# Patient Record
Sex: Male | Born: 1960 | Race: White | Hispanic: No | Marital: Single | Smoking: Never smoker
Health system: Southern US, Community
[De-identification: ages and names within clinical notes are randomized; demographics above are authoritative.]

## PROBLEM LIST (undated history)

## (undated) DIAGNOSIS — E119 Type 2 diabetes mellitus without complications: Secondary | ICD-10-CM

## (undated) DIAGNOSIS — J189 Pneumonia, unspecified organism: Secondary | ICD-10-CM

## (undated) DIAGNOSIS — E669 Obesity, unspecified: Secondary | ICD-10-CM

## (undated) DIAGNOSIS — R0600 Dyspnea, unspecified: Secondary | ICD-10-CM

## (undated) DIAGNOSIS — I10 Essential (primary) hypertension: Secondary | ICD-10-CM

## (undated) HISTORY — PX: GASTRIC BYPASS: SHX52

## (undated) HISTORY — PX: WISDOM TOOTH EXTRACTION: SHX21

---

## 2019-07-02 ENCOUNTER — Inpatient Hospital Stay (HOSPITAL_COMMUNITY)
Admission: AD | Admit: 2019-07-02 | Discharge: 2019-07-07 | DRG: 287 | Disposition: A | Discharge status: Home / Self Care | Payer: Self-pay | Source: Other Acute Inpatient Hospital | Attending: Cardiology | Admitting: Cardiology

## 2019-07-02 ENCOUNTER — Inpatient Hospital Stay (HOSPITAL_COMMUNITY): Payer: Self-pay

## 2019-07-02 ENCOUNTER — Encounter (HOSPITAL_COMMUNITY): Payer: Self-pay | Admitting: Cardiology

## 2019-07-02 DIAGNOSIS — I483 Typical atrial flutter: Secondary | ICD-10-CM

## 2019-07-02 DIAGNOSIS — R0602 Shortness of breath: Secondary | ICD-10-CM | POA: Diagnosis present

## 2019-07-02 DIAGNOSIS — Z20822 Contact with and (suspected) exposure to covid-19: Secondary | ICD-10-CM | POA: Diagnosis present

## 2019-07-02 DIAGNOSIS — I472 Ventricular tachycardia, unspecified: Secondary | ICD-10-CM

## 2019-07-02 DIAGNOSIS — E1165 Type 2 diabetes mellitus with hyperglycemia: Secondary | ICD-10-CM | POA: Diagnosis present

## 2019-07-02 DIAGNOSIS — I5020 Unspecified systolic (congestive) heart failure: Secondary | ICD-10-CM

## 2019-07-02 DIAGNOSIS — Z7984 Long term (current) use of oral hypoglycemic drugs: Secondary | ICD-10-CM

## 2019-07-02 DIAGNOSIS — I4892 Unspecified atrial flutter: Secondary | ICD-10-CM | POA: Diagnosis present

## 2019-07-02 DIAGNOSIS — Z9884 Bariatric surgery status: Secondary | ICD-10-CM

## 2019-07-02 DIAGNOSIS — R451 Restlessness and agitation: Secondary | ICD-10-CM | POA: Diagnosis not present

## 2019-07-02 DIAGNOSIS — Z6841 Body Mass Index (BMI) 40.0 and over, adult: Secondary | ICD-10-CM

## 2019-07-02 DIAGNOSIS — I110 Hypertensive heart disease with heart failure: Secondary | ICD-10-CM | POA: Diagnosis present

## 2019-07-02 DIAGNOSIS — Z79899 Other long term (current) drug therapy: Secondary | ICD-10-CM

## 2019-07-02 DIAGNOSIS — Z23 Encounter for immunization: Secondary | ICD-10-CM

## 2019-07-02 HISTORY — DX: Essential (primary) hypertension: I10

## 2019-07-02 HISTORY — DX: Pneumonia, unspecified organism: J18.9

## 2019-07-02 HISTORY — DX: Dyspnea, unspecified: R06.00

## 2019-07-02 HISTORY — DX: Type 2 diabetes mellitus without complications: E11.9

## 2019-07-02 HISTORY — DX: Obesity, unspecified: E66.9

## 2019-07-02 LAB — CBC WITH DIFFERENTIAL/PLATELET
Abs Immature Granulocytes: 0.04 10*3/uL (ref 0.00–0.07)
Basophils Absolute: 0 10*3/uL (ref 0.0–0.1)
Basophils Relative: 0 %
Eosinophils Absolute: 0 10*3/uL (ref 0.0–0.5)
Eosinophils Relative: 0 %
HCT: 43.2 % (ref 39.0–52.0)
Hemoglobin: 13.8 g/dL (ref 13.0–17.0)
Immature Granulocytes: 1 %
Lymphocytes Relative: 18 %
Lymphs Abs: 1.5 10*3/uL (ref 0.7–4.0)
MCH: 29.9 pg (ref 26.0–34.0)
MCHC: 31.9 g/dL (ref 30.0–36.0)
MCV: 93.7 fL (ref 80.0–100.0)
Monocytes Absolute: 0.6 10*3/uL (ref 0.1–1.0)
Monocytes Relative: 7 %
Neutro Abs: 6.1 10*3/uL (ref 1.7–7.7)
Neutrophils Relative %: 74 %
Platelets: 283 10*3/uL (ref 150–400)
RBC: 4.61 MIL/uL (ref 4.22–5.81)
RDW: 13.5 % (ref 11.5–15.5)
WBC: 8.2 10*3/uL (ref 4.0–10.5)
nRBC: 0 % (ref 0.0–0.2)

## 2019-07-02 LAB — BASIC METABOLIC PANEL
Anion gap: 14 (ref 5–15)
BUN: 15 mg/dL (ref 6–20)
CO2: 23 mmol/L (ref 22–32)
Calcium: 8.5 mg/dL — ABNORMAL LOW (ref 8.9–10.3)
Chloride: 101 mmol/L (ref 98–111)
Creatinine, Ser: 1.27 mg/dL — ABNORMAL HIGH (ref 0.61–1.24)
GFR calc Af Amer: >60 mL/min (ref >60)
GFR calc non Af Amer: >60 mL/min (ref >60)
Glucose, Bld: 248 mg/dL — ABNORMAL HIGH (ref 70–99)
Potassium: 3.8 mmol/L (ref 3.5–5.1)
Sodium: 138 mmol/L (ref 135–145)

## 2019-07-02 LAB — HEMOGLOBIN A1C
Hgb A1c MFr Bld: 11.7 % — ABNORMAL HIGH (ref 4.8–5.6)
Mean Plasma Glucose: 289.09 mg/dL

## 2019-07-02 LAB — GLUCOSE, CAPILLARY
Glucose-Capillary: 229 mg/dL — ABNORMAL HIGH (ref 70–99)
Glucose-Capillary: 295 mg/dL — ABNORMAL HIGH (ref 70–99)

## 2019-07-02 LAB — HIV ANTIBODY (ROUTINE TESTING W REFLEX): HIV Screen 4th Generation wRfx: NONREACTIVE

## 2019-07-02 MED ORDER — INSULIN ASPART 100 UNIT/ML ~~LOC~~ SOLN
6.0000 [IU] | Freq: Three times a day (TID) | SUBCUTANEOUS | Status: DC
  Administered 2019-07-03 (×2): 6 [IU] via SUBCUTANEOUS

## 2019-07-02 MED ORDER — FUROSEMIDE 10 MG/ML IJ SOLN
40.0000 mg | Freq: Two times a day (BID) | INTRAMUSCULAR | Status: DC
  Administered 2019-07-02 – 2019-07-05 (×6): 40 mg via INTRAVENOUS
  Filled 2019-07-02 (×8): qty 4

## 2019-07-02 MED ORDER — ACETAMINOPHEN 325 MG PO TABS
650.0000 mg | ORAL_TABLET | ORAL | Status: DC | PRN
  Administered 2019-07-03: 650 mg via ORAL
  Filled 2019-07-02: qty 2

## 2019-07-02 MED ORDER — INSULIN ASPART 100 UNIT/ML ~~LOC~~ SOLN
0.0000 [IU] | Freq: Every day | SUBCUTANEOUS | Status: DC
  Administered 2019-07-02: 3 [IU] via SUBCUTANEOUS

## 2019-07-02 MED ORDER — ATORVASTATIN CALCIUM 40 MG PO TABS
40.0000 mg | ORAL_TABLET | Freq: Every day | ORAL | Status: DC
  Administered 2019-07-03 – 2019-07-07 (×5): 40 mg via ORAL
  Filled 2019-07-02 (×5): qty 1

## 2019-07-02 MED ORDER — PNEUMOCOCCAL VAC POLYVALENT 25 MCG/0.5ML IJ INJ
0.5000 mL | INJECTION | INTRAMUSCULAR | Status: AC
  Administered 2019-07-07: 0.5 mL via INTRAMUSCULAR
  Filled 2019-07-02: qty 0.5

## 2019-07-02 MED ORDER — ONDANSETRON HCL 4 MG/2ML IJ SOLN: 4.0000 mg | Freq: Four times a day (QID) | INTRAMUSCULAR | Status: DC | PRN

## 2019-07-02 MED ORDER — INSULIN ASPART 100 UNIT/ML ~~LOC~~ SOLN
0.0000 [IU] | Freq: Three times a day (TID) | SUBCUTANEOUS | Status: DC
  Administered 2019-07-03: 4 [IU] via SUBCUTANEOUS
  Administered 2019-07-03: 7 [IU] via SUBCUTANEOUS

## 2019-07-03 ENCOUNTER — Encounter (HOSPITAL_COMMUNITY): Payer: Self-pay | Admitting: Cardiology

## 2019-07-03 ENCOUNTER — Inpatient Hospital Stay (HOSPITAL_COMMUNITY): Payer: Self-pay

## 2019-07-03 LAB — GLUCOSE, CAPILLARY
Glucose-Capillary: 209 mg/dL — ABNORMAL HIGH (ref 70–99)
Glucose-Capillary: 223 mg/dL — ABNORMAL HIGH (ref 70–99)
Glucose-Capillary: 198 mg/dL — ABNORMAL HIGH (ref 70–99)
Glucose-Capillary: 233 mg/dL — ABNORMAL HIGH (ref 70–99)
Glucose-Capillary: 230 mg/dL — ABNORMAL HIGH (ref 70–99)
Glucose-Capillary: 202 mg/dL — ABNORMAL HIGH (ref 70–99)
Glucose-Capillary: 198 mg/dL — ABNORMAL HIGH (ref 70–99)

## 2019-07-03 LAB — MRSA PCR SCREENING: MRSA by PCR: POSITIVE — AB

## 2019-07-03 LAB — BASIC METABOLIC PANEL
Anion gap: 9 (ref 5–15)
BUN: 14 mg/dL (ref 6–20)
CO2: 26 mmol/L (ref 22–32)
Calcium: 8.5 mg/dL — ABNORMAL LOW (ref 8.9–10.3)
Chloride: 103 mmol/L (ref 98–111)
Creatinine, Ser: 1.21 mg/dL (ref 0.61–1.24)
GFR calc Af Amer: >60 mL/min (ref >60)
GFR calc non Af Amer: >60 mL/min (ref >60)
Glucose, Bld: 246 mg/dL — ABNORMAL HIGH (ref 70–99)
Potassium: 3.4 mmol/L — ABNORMAL LOW (ref 3.5–5.1)
Sodium: 138 mmol/L (ref 135–145)

## 2019-07-03 LAB — LIPID PANEL
Cholesterol: 177 mg/dL (ref 0–200)
HDL: 30 mg/dL — ABNORMAL LOW (ref >40)
LDL Cholesterol: 128 mg/dL — ABNORMAL HIGH (ref 0–99)
Total CHOL/HDL Ratio: 5.9 RATIO
Triglycerides: 96 mg/dL (ref <150)
VLDL: 19 mg/dL (ref 0–40)

## 2019-07-03 LAB — CBC
HCT: 41.7 % (ref 39.0–52.0)
Hemoglobin: 13.2 g/dL (ref 13.0–17.0)
MCH: 29.8 pg (ref 26.0–34.0)
MCHC: 31.7 g/dL (ref 30.0–36.0)
MCV: 94.1 fL (ref 80.0–100.0)
Platelets: 267 10*3/uL (ref 150–400)
RBC: 4.43 MIL/uL (ref 4.22–5.81)
RDW: 13.3 % (ref 11.5–15.5)
WBC: 8.3 10*3/uL (ref 4.0–10.5)
nRBC: 0 % (ref 0.0–0.2)

## 2019-07-03 LAB — HEPARIN LEVEL (UNFRACTIONATED)
Heparin Unfractionated: 0.17 IU/mL — ABNORMAL LOW (ref 0.30–0.70)
Heparin Unfractionated: 0.28 IU/mL — ABNORMAL LOW (ref 0.30–0.70)

## 2019-07-03 MED ORDER — INSULIN ASPART 100 UNIT/ML ~~LOC~~ SOLN
0.0000 [IU] | Freq: Three times a day (TID) | SUBCUTANEOUS | Status: DC
  Administered 2019-07-03 – 2019-07-04 (×2): 5 [IU] via SUBCUTANEOUS
  Administered 2019-07-04: 2 [IU] via SUBCUTANEOUS
  Administered 2019-07-04: 8 [IU] via SUBCUTANEOUS
  Administered 2019-07-05: 5 [IU] via SUBCUTANEOUS
  Administered 2019-07-05: 3 [IU] via SUBCUTANEOUS
  Administered 2019-07-06: 5 [IU] via SUBCUTANEOUS
  Administered 2019-07-06 (×2): 2 [IU] via SUBCUTANEOUS
  Administered 2019-07-07: 8 [IU] via SUBCUTANEOUS
  Administered 2019-07-07: 2 [IU] via SUBCUTANEOUS

## 2019-07-03 MED ORDER — CHLORHEXIDINE GLUCONATE CLOTH 2 % EX PADS
6.0000 | MEDICATED_PAD | Freq: Every day | CUTANEOUS | Status: DC
  Administered 2019-07-05 – 2019-07-06 (×2): 6 via TOPICAL

## 2019-07-03 MED ORDER — INSULIN STARTER KIT- PEN NEEDLES (ENGLISH)
1.0000 | Freq: Once | Status: DC
  Filled 2019-07-03: qty 1

## 2019-07-03 MED ORDER — HEPARIN BOLUS VIA INFUSION
5000.0000 [IU] | Freq: Once | INTRAVENOUS | Status: AC
  Administered 2019-07-03: 5000 [IU] via INTRAVENOUS
  Filled 2019-07-03: qty 5000

## 2019-07-03 MED ORDER — INSULIN DETEMIR 100 UNIT/ML ~~LOC~~ SOLN
18.0000 [IU] | Freq: Two times a day (BID) | SUBCUTANEOUS | Status: DC
  Filled 2019-07-03: qty 0.18

## 2019-07-03 MED ORDER — AMIODARONE HCL IN DEXTROSE 360-4.14 MG/200ML-% IV SOLN
30.0000 mg/h | INTRAVENOUS | Status: DC
  Administered 2019-07-03 – 2019-07-04 (×3): 30 mg/h via INTRAVENOUS
  Filled 2019-07-03 (×3): qty 200

## 2019-07-03 MED ORDER — INSULIN ASPART 100 UNIT/ML ~~LOC~~ SOLN
0.0000 [IU] | Freq: Every day | SUBCUTANEOUS | Status: DC
  Administered 2019-07-05: 2 [IU] via SUBCUTANEOUS

## 2019-07-03 MED ORDER — POTASSIUM CHLORIDE CRYS ER 20 MEQ PO TBCR
40.0000 meq | EXTENDED_RELEASE_TABLET | Freq: Once | ORAL | Status: AC
  Administered 2019-07-03: 40 meq via ORAL
  Filled 2019-07-03: qty 2

## 2019-07-03 MED ORDER — METOPROLOL SUCCINATE ER 50 MG PO TB24
50.0000 mg | ORAL_TABLET | Freq: Every day | ORAL | Status: DC
  Administered 2019-07-03 – 2019-07-07 (×5): 50 mg via ORAL
  Filled 2019-07-03 (×5): qty 1

## 2019-07-03 MED ORDER — LIVING WELL WITH DIABETES BOOK
Freq: Once | Status: AC
  Filled 2019-07-03: qty 1

## 2019-07-03 MED ORDER — CHLORHEXIDINE GLUCONATE CLOTH 2 % EX PADS: 6.0000 | MEDICATED_PAD | Freq: Every day | CUTANEOUS | Status: DC

## 2019-07-03 MED ORDER — MUPIROCIN 2 % EX OINT
1.0000 "application " | TOPICAL_OINTMENT | Freq: Two times a day (BID) | CUTANEOUS | Status: DC
  Administered 2019-07-03 – 2019-07-07 (×9): 1 via NASAL
  Filled 2019-07-03: qty 22

## 2019-07-03 MED ORDER — HEPARIN (PORCINE) 25000 UT/250ML-% IV SOLN
2200.0000 [IU]/h | INTRAVENOUS | Status: DC
  Administered 2019-07-03: 1650 [IU]/h via INTRAVENOUS
  Administered 2019-07-03: 1950 [IU]/h via INTRAVENOUS
  Administered 2019-07-04 – 2019-07-06 (×5): 2200 [IU]/h via INTRAVENOUS
  Filled 2019-07-03 (×7): qty 250

## 2019-07-03 MED ORDER — INSULIN DETEMIR 100 UNIT/ML ~~LOC~~ SOLN
18.0000 [IU] | Freq: Two times a day (BID) | SUBCUTANEOUS | Status: DC
  Administered 2019-07-03 – 2019-07-07 (×9): 18 [IU] via SUBCUTANEOUS
  Filled 2019-07-03 (×9): qty 0.18

## 2019-07-03 NOTE — Consult Note (Deleted)
CARDIOLOGY CONSULT NOTE  Patient ID: Ethan Hamilton MRN: [id number] DOB/AGE: 1960-07-14 59 y.o.  Admit date: 07/02/2019 Referring Physician: [facility] Primary Physician: [facility] Reason for Consultation:  VT  HPI:   59 y.o. [place] male  with hypertension, type 2 DM, morbid obesity, admitted with shortness of breath, atrial flutter, VT.  Patient is a retired Administrator, is uninsured, lives alone and does not have any family nearby. He has not seen a physician in a long time. Patient has had worsening shortness of breath and leg edema in 2-3 weeks. He denies chest pain. He presented to [facility] ED today, where he was found to be in atrial flutter. Subsequently, his shortness of breath worsened. He subsequently had VT, and had to be cardioverted. Given VT, I was consulted at the request of general cardiology on call Dr. Terrence Dupont.   On arrival to [facility], patient denies any chest pain. Shortness of breath is improved.   Past Medical History:  Diagnosis Date  . Diabetes mellitus without complication ([facility])   . Dyspnea   . Hypertension   . Pneumonia      Past Surgical History:  Procedure Laterality Date  . GASTRIC BYPASS    . WISDOM TOOTH EXTRACTION       History reviewed. No pertinent family history.   Social History: Social History   Socioeconomic History  . Marital status: Single    Spouse name: Not on file  . Number of children: Not on file  . Years of education: Not on file  . Highest education level: Not on file  Occupational History  . Not on file  Tobacco Use  . Smoking status: Not on file  Substance and Sexual Activity  . Alcohol use: Not Currently  . Drug use: Not Currently  . Sexual activity: Not Currently  Other Topics Concern  . Not on file  Social History Narrative  . Not on file   Social Determinants of Health   Financial Resource Strain:   . Difficulty of Paying Living Expenses:   Food Insecurity:   . Worried About Charity fundraiser in the  Last Year:   . Arboriculturist in the Last Year:   Transportation Needs:   . Film/video editor (Medical):   Marland Kitchen Lack of Transportation (Non-Medical):   Physical Activity:   . Days of Exercise per Week:   . Minutes of Exercise per Session:   Stress:   . Feeling of Stress :   Social Connections:   . Frequency of Communication with Friends and Family:   . Frequency of Social Gatherings with Friends and Family:   . Attends Religious Services:   . Active Member of Clubs or Organizations:   . Attends Archivist Meetings:   Marland Kitchen Marital Status:   Intimate Partner Violence:   . Fear of Current or Ex-Partner:   . Emotionally Abused:   Marland Kitchen Physically Abused:   . Sexually Abused:      Medications Prior to Admission  Medication Sig Dispense Refill Last Dose  . amLODipine (NORVASC) 5 MG tablet Take 5 mg by mouth 2 (two) times daily.   August 2020  . atorvastatin (LIPITOR) 40 MG tablet Take 40 mg by mouth daily.   August 2020  . glimepiride (AMARYL) 4 MG tablet Take 4 mg by mouth 2 (two) times daily. Breakfast and supper   August 2020  . lisinopril-hydrochlorothiazide (ZESTORETIC) 20-12.5 MG tablet Take 1 tablet by mouth 2 (two) times daily.  August 2020  . metFORMIN (GLUCOPHAGE-XR) 500 MG 24 hr tablet Take 1,000 mg by mouth 2 (two) times daily.   August 2020  . metoprolol tartrate (LOPRESSOR) 50 MG tablet Take 50 mg by mouth 2 (two) times daily.   August 2020  . Semaglutide, 1 MG/DOSE, 2 MG/1.5ML SOPN Inject 0.75 mLs into the skin once a week.   August 2020    Review of Systems  Constitution: Negative for decreased appetite, malaise/fatigue, weight gain and weight loss.  HENT: Negative for congestion.   Eyes: Negative for visual disturbance.  Cardiovascular: Positive for dyspnea on exertion and leg swelling. Negative for chest pain, palpitations and syncope.  Respiratory: Negative for cough.   Endocrine: Negative for cold intolerance.  Hematologic/Lymphatic: Does not  bruise/bleed easily.  Skin: Negative for itching and rash.  Musculoskeletal: Negative for myalgias.  Gastrointestinal: Negative for abdominal pain, nausea and vomiting.  Genitourinary: Negative for dysuria.  Neurological: Negative for dizziness and weakness.  Psychiatric/Behavioral: The patient is not nervous/anxious.   All other systems reviewed and are negative.     Physical Exam: Physical Exam  Constitutional: He is oriented to person, place, and time. He appears well-developed and well-nourished. No distress.  Morbid obesity  HENT:  Head: Normocephalic and atraumatic.  Eyes: Pupils are equal, round, and reactive to light. Conjunctivae are normal.  Neck: No JVD present.  Cardiovascular: Normal rate, regular rhythm and intact distal pulses.  No murmur heard. Pulmonary/Chest: Effort normal. He has no wheezes. He has rales.  Abdominal: Soft. Bowel sounds are normal. There is no rebound.  Musculoskeletal:        General: Edema (2+ b/l) present.  Lymphadenopathy:    He has no cervical adenopathy.  Neurological: He is alert and oriented to person, place, and time. No cranial nerve deficit.  Skin: Skin is warm and dry.  Psychiatric: He has a normal mood and affect.  Nursing note and vitals reviewed.    Labs:   Lab Results  Component Value Date   WBC 8.2 07/02/2019   HGB 13.8 07/02/2019   HCT 43.2 07/02/2019   MCV 93.7 07/02/2019   PLT 283 07/02/2019    Recent Labs  Lab 07/02/19 2138  NA 138  K 3.8  CL 101  CO2 23  BUN 15  CREATININE 1.27*  CALCIUM 8.5*  GLUCOSE 248*    Lipid Panel  No results found for: CHOL, TRIG, HDL, CHOLHDL, VLDL, LDLCALC  BNP (last 3 results) No results for input(s): BNP in the last 8760 hours.  HEMOGLOBIN A1C Lab Results  Component Value Date   HGBA1C 11.7 (H) 07/02/2019   MPG 289.09 07/02/2019    Cardiac Panel (last 3 results) No results for input(s): CKTOTAL, CKMB, RELINDX in the last 8760 hours.  Invalid input(s):  TROPONINHS  No results found for: CKTOTAL, CKMB, CKMBINDEX   TSH No results for input(s): TSH in the last 8760 hours.    Radiology: DG Chest 1 View  Result Date: 07/02/2019 CLINICAL DATA:  Shortness of breath EXAM: CHEST  1 VIEW COMPARISON:  07/02/2019 FINDINGS: Cardiomegaly with vascular congestion and hazy perihilar interstitial opacity. Possible small right effusion. No pneumothorax. IMPRESSION: Cardiomegaly with vascular congestion and bilateral perihilar hazy opacity, possible mild edema. Probable small right effusion. Electronically Signed   By: Jasmine Pang M.D.   On: 07/02/2019 22:11    Scheduled Meds: . atorvastatin  40 mg Oral Daily  . furosemide  40 mg Intravenous Q12H  . insulin aspart  0-20 Units Subcutaneous TID  WC  . insulin aspart  0-5 Units Subcutaneous QHS  . insulin aspart  6 Units Subcutaneous TID WC  . pneumococcal 23 valent vaccine  0.5 mL Intramuscular Tomorrow-1000   Continuous Infusions: PRN Meds:.acetaminophen, ondansetron (ZOFRAN) IV  CARDIAC STUDIES:  EKG 07/02/2019: Sinus rhythm, left axis deviation, occasional PAC  Echocardiogram pending:  Assessment & Recommendations:  59 y.o. [place] male  with hypertension, type 2 DM, morbid obesity, admitted with shortness of breath, atrial flutter, VT.  Paroxysmal atrial flutter: Currently in sinus rhythm. CHA2DS2CVAsc score at least 2, likely higher-as suspecting heart failure Continue heparin for now with possibility of requiring cardiac catheterization at some point.   VT: Monomorphic VT. Currently resolved. Obtain echocardiogram.  DM: SSI, and meal coverage Consult diabetes care coordinator  Hypertension: Uncontrolled. Will likely add [facility] tomorrow  Elder Negus, MD 07/03/2019, 1:00 AM [facility] Pager: [phone] Office: [phone] If no answer Cell [phone]

## 2019-07-03 NOTE — Progress Notes (Signed)
Echo pending Rate and BP controlled Needs to follow strict I/O monitoring Transfer to progressive care Will wean off amiodarone tomorrow Potentially cath next week Needs social work input. Uninsured patient  Elder Negus, MD [facility] Pager: [phone] Office: [phone]

## 2019-07-03 NOTE — Consult Note (Signed)
WOC Nurse Consult Note: Patient receiving care in [facility] 2H20.  Patient sitting in chair eating breakfast, watching TV at the time of my visit. Reason for Consult: "undiagnosed wound, right lower leg" Wound type: No open wounds present to RLE, only dried, scabbed, scattered areas along the pretibial area. Pressure Injury POA: Yes/No/NA Measurement: Wound bed: Hemosiderin staining present to gaiter area of BLE, RLE staining > than LLE. Drainage (amount, consistency, odor) none Periwound: intact Dressing procedure/placement/frequency: No dressing or topical treatment needed at this time.  The patient does need properly sized compression hose with appropriate pressure capability to prevent further deterioration in the condition of his legs, all related to venous insufficiency.  The patient is willing to try compression hose.  For now he is willing to keep his legs elevated as much as possible. Monitor the wound area(s) for worsening of condition such as: Signs/symptoms of infection,  Increase in size,  Development of or worsening of odor, Development of pain, or increased pain at the affected locations.  Notify the medical team if any of these develop.  Thank you for the consult.  Discussed plan of care with the patient and bedside nurse.  WOC nurse will not follow at this time.  Please re-consult the WOC team if needed.  Helmut Muster, RN, MSN, CWOCN, CNS-BC, pager [phone]

## 2019-07-03 NOTE — Progress Notes (Signed)
Patient arrives to [facility] and walks directly into the bathroom.  Unable to measure urine output. Monitors applied and vital signs obtained.  Levimir due at 1800 given in left arm.  Report to night shift

## 2019-07-03 NOTE — Progress Notes (Addendum)
Inpatient Diabetes Program Recommendations  AACE/ADA: New Consensus Statement on Inpatient Glycemic Control (2015)  Target Ranges:  Prepandial:   less than 140 mg/dL      Peak postprandial:   less than 180 mg/dL (1-2 hours)      Critically ill patients:  140 - 180 mg/dL   Lab Results  Component Value Date   GLUCAP 198 (H) 07/03/2019   HGBA1C 11.7 (H) 07/02/2019    Review of Glycemic Control Results for Ethan Hamilton, Ethan Hamilton (MRN [id number]) as of 07/03/2019 10:59  Ref. Range 07/02/2019 21:48 07/02/2019 23:52 07/03/2019 04:00 07/03/2019 06:32 07/03/2019 06:55  Glucose-Capillary Latest Ref Range: 70 - 99 mg/dL 229 (H) 295 (H) 223 (H) 233 (H) 198 (H)   Diabetes history: DM2 Outpatient Diabetes medications: Amaryl 4 mg bid + Metformin 1 gm bid + Semaglutide 0.75 q week Current orders for Inpatient glycemic control: Novolog 6 units tid meal coverage + Novolog resistant tid + hs 0-5 units  Inpatient Diabetes Program Recommendations:   -Add Levemir 18 units bid (0.2 units/kg x 178 kg) -Decrease Novolog correction to moderate tid + hs 0-5 units  Consults to transition of care, Living Well With Diabetes book, starter kit for insulin teaching if patient goes home on insulin. Noted patient does not have insurance, so may utilize buying insulin 70/30 Novolin insulin from [facility]. Will speak with patient to determine total patient needs. 1:10 pm Met with patient @ bedside. 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 Another job opportunity fell through and now does not have funds to purchase his medications and behind in rent and living expenses. States his A1c was between 6-7 when he could afford to take his medications.  Thank you, Nani Gasser. Rylin Saez, RN, MSN, CDE  Diabetes Coordinator Inpatient Glycemic Control Team Team Pager [phone] (8am-5pm) 07/03/2019 11:05 AM

## 2019-07-03 NOTE — Progress Notes (Addendum)
ANTICOAGULATION CONSULT NOTE   Pharmacy Consult for heparin Indication: atrial fibrillation  No Known Allergies  Patient Measurements: Height: 5' 10"  (177.8 cm) Weight: (!) 178 kg (392 lb 6.7 oz) IBW/kg (Calculated) : 73 Heparin Dosing Weight: 117 kg  Vital Signs: Temp: 97.4 F (36.3 C) (05/21 2002) Temp Source: Oral (05/21 2002) BP: 145/106 (05/21 2002) Pulse Rate: 83 (05/21 2002)  Labs: Recent Labs    07/02/19 2138 07/03/19 0351 07/03/19 0942 07/03/19 1937  HGB 13.8 13.2  --   --   HCT 43.2 41.7  --   --   PLT 283 267  --   --   HEPARINUNFRC  --   --  0.17* 0.28*  CREATININE 1.27* 1.21  --   --     Estimated Creatinine Clearance: 106.9 mL/min (by C-G formula based on SCr of 1.21 mg/dL).   Medical History: Past Medical History:  Diagnosis Date  . Diabetes mellitus without complication ([place])   . Dyspnea   . Hypertension   . Obesity   . Pneumonia     Medications:  Scheduled:  . atorvastatin  40 mg Oral Daily  . [START ON 07/04/2019] Chlorhexidine Gluconate Cloth  6 each Topical Q0600  . furosemide  40 mg Intravenous Q12H  . insulin aspart  0-15 Units Subcutaneous TID WC  . insulin aspart  0-5 Units Subcutaneous QHS  . insulin detemir  18 Units Subcutaneous BID  . insulin starter kit- pen needles  1 kit Other Once  . metoprolol succinate  50 mg Oral Daily  . mupirocin ointment  1 application Nasal BID  . pneumococcal 23 valent vaccine  0.5 mL Intramuscular Tomorrow-1000    Assessment: 21 yom admitted with SOB and atrial fibrillation - not on AC PTA.   Hgb 13.2, plt 267 - stable. No s/sx of bleeding. Will continue heparin for now and follow up for additional procedures.   Heparin level improved but remains slightly below goal (0.28). No bleeding or issues with infusion per discussion with RN.  Goal of Therapy:  Heparin level 0.3-0.7 units/ml Monitor platelets by anticoagulation protocol: Yes   Plan:  Increase heparin infusion to 2200 units/hr 6hr  heparin level Monitor daily heparin level and CBC, s/sx bleeding   Arturo Morton, PharmD, BCPS Please check AMION for all [facility] contact numbers Clinical Pharmacist 07/03/2019 8:08 PM

## 2019-07-03 NOTE — H&P (Signed)
Ethan Hamilton is an 59 y.o. male.   Chief Complaint: Shortness of breath HPI:   59 y.o. [place] male  with hypertension, type 2 DM, morbid obesity, admitted with shortness of breath, atrial flutter, VT.  Patient is a retired Naval architect, is uninsured, lives alone and does not have any family nearby. He has not seen a physician in a long time. Patient has had worsening shortness of breath and leg edema in 2-3 weeks. He denies chest pain. He presented to [facility] ED today, where he was found to be in atrial flutter. Subsequently, his shortness of breath worsened. He subsequently had VT, and had to be cardioverted. Given VT, I was consulted at the request of general cardiology on call Dr. Sharyn Lull.   On arrival to [facility], patient denies any chest pain. Shortness of breath is improved.    Past Medical History:  Diagnosis Date  . Diabetes mellitus without complication ([facility])   . Dyspnea   . Hypertension   . Obesity   . Pneumonia     Past Surgical History:  Procedure Laterality Date  . GASTRIC BYPASS    . WISDOM TOOTH EXTRACTION      History reviewed. No pertinent family history. Social History:  reports previous alcohol use. He reports previous drug use. No history on file for tobacco.  Allergies: No Known Allergies  Review of Systems  Constitution: Negative for decreased appetite, malaise/fatigue, weight gain and weight loss.  HENT: Negative for congestion.   Eyes: Negative for visual disturbance.  Cardiovascular: Positive for dyspnea on exertion and leg swelling. Negative for chest pain, palpitations and syncope.  Respiratory: Negative for cough.   Endocrine: Negative for cold intolerance.  Hematologic/Lymphatic: Does not bruise/bleed easily.  Skin: Negative for itching and rash.  Musculoskeletal: Negative for myalgias.  Gastrointestinal: Negative for abdominal pain, nausea and vomiting.  Genitourinary: Negative for dysuria.  Neurological: Negative for dizziness and weakness.   Psychiatric/Behavioral: The patient is not nervous/anxious.   All other systems reviewed and are negative.    Blood pressure (!) 139/113, pulse 74, temperature 98.3 F (36.8 C), temperature source Oral, resp. rate 19, height 5\' 10"  (1.778 m), weight (!) 178 kg, SpO2 93 %. Body mass index is 56.31 kg/m.  Physical Exam  Constitutional: He is oriented to person, place, and time. He appears well-developed and well-nourished. No distress.  Morbid obesity  HENT:  Head: Normocephalic and atraumatic.  Eyes: Pupils are equal, round, and reactive to light. Conjunctivae are normal.  Neck: No JVD present.  Cardiovascular: Normal rate, regular rhythm and intact distal pulses.  Pulmonary/Chest: Effort normal and breath sounds normal. He has no wheezes. He has no rales.  Abdominal: Soft. Bowel sounds are normal. There is no rebound.  Musculoskeletal:        General: Edema (2+ b/l) present.  Lymphadenopathy:    He has no cervical adenopathy.  Neurological: He is alert and oriented to person, place, and time. No cranial nerve deficit.  Skin: Skin is warm and dry.  Psychiatric: He has a normal mood and affect.  Nursing note and vitals reviewed.   Results for orders placed or performed during the hospital encounter of 07/02/19 (from the past 48 hour(s))  HIV Antibody (routine testing w rflx)     Status: None   Collection Time: 07/02/19  9:38 PM  Result Value Ref Range   HIV Screen 4th Generation wRfx Non Reactive Non Reactive    Comment: Performed at [facility], [address],  [place], [place] [address]  Basic metabolic panel     Status: Abnormal   Collection Time: 07/02/19  9:38 PM  Result Value Ref Range   Sodium 138 135 - 145 mmol/L   Potassium 3.8 3.5 - 5.1 mmol/L   Chloride 101 98 - 111 mmol/L   CO2 23 22 - 32 mmol/L   Glucose, Bld 248 (H) 70 - 99 mg/dL    Comment: Glucose reference range applies only to samples taken after fasting for at least 8 hours.   BUN 15 6 - 20 mg/dL    Creatinine, Ser 1.27 (H) 0.61 - 1.24 mg/dL   Calcium 8.5 (L) 8.9 - 10.3 mg/dL   GFR calc non Af Amer >60 >60 mL/min   GFR calc Af Amer >60 >60 mL/min   Anion gap 14 5 - 15    Comment: Performed at [facility] [address], [place], [place] [address]  Hemoglobin A1c     Status: Abnormal   Collection Time: 07/02/19  9:38 PM  Result Value Ref Range   Hgb A1c MFr Bld 11.7 (H) 4.8 - 5.6 %    Comment: (NOTE) Pre diabetes:          5.7%-6.4% Diabetes:              >6.4% Glycemic control for   <7.0% adults with diabetes    Mean Plasma Glucose 289.09 mg/dL    Comment: Performed at [facility] [address] [place],  [address]  CBC WITH DIFFERENTIAL     Status: None   Collection Time: 07/02/19  9:38 PM  Result Value Ref Range   WBC 8.2 4.0 - 10.5 K/uL   RBC 4.61 4.22 - 5.81 MIL/uL   Hemoglobin 13.8 13.0 - 17.0 g/dL   HCT 43.2 39.0 - 52.0 %   MCV 93.7 80.0 - 100.0 fL   MCH 29.9 26.0 - 34.0 pg   MCHC 31.9 30.0 - 36.0 g/dL   RDW 13.5 11.5 - 15.5 %   Platelets 283 150 - 400 K/uL   nRBC 0.0 0.0 - 0.2 %   Neutrophils Relative % 74 %   Neutro Abs 6.1 1.7 - 7.7 K/uL   Lymphocytes Relative 18 %   Lymphs Abs 1.5 0.7 - 4.0 K/uL   Monocytes Relative 7 %   Monocytes Absolute 0.6 0.1 - 1.0 K/uL   Eosinophils Relative 0 %   Eosinophils Absolute 0.0 0.0 - 0.5 K/uL   Basophils Relative 0 %   Basophils Absolute 0.0 0.0 - 0.1 K/uL   Immature Granulocytes 1 %   Abs Immature Granulocytes 0.04 0.00 - 0.07 K/uL    Comment: Performed at [facility], [address] [place], [place] [address]  Glucose, capillary     Status: Abnormal   Collection Time: 07/02/19  9:48 PM  Result Value Ref Range   Glucose-Capillary 229 (H) 70 - 99 mg/dL    Comment: Glucose reference range applies only to samples taken after fasting for at least 8 hours.  MRSA PCR Screening     Status: Abnormal   Collection Time: 07/02/19 11:20 PM   Specimen: Nasal Mucosa; Nasopharyngeal  Result  Value Ref Range   MRSA by PCR POSITIVE (A) NEGATIVE    Comment: CRITICAL RESULT CALLED TO, READ BACK BY AND VERIFIED WITH: RN, Jaci Lazier [phone] @0124  THANEY Performed at [facility] [address], [place], [place] [address]   Glucose, capillary     Status:  Abnormal   Collection Time: 07/02/19 11:52 PM  Result Value Ref Range   Glucose-Capillary 295 (H) 70 - 99 mg/dL    Comment: Glucose reference range applies only to samples taken after fasting for at least 8 hours.    Labs:   Lab Results  Component Value Date   WBC 8.2 07/02/2019   HGB 13.8 07/02/2019   HCT 43.2 07/02/2019   MCV 93.7 07/02/2019   PLT 283 07/02/2019    Recent Labs  Lab 07/02/19 2138  NA 138  K 3.8  CL 101  CO2 23  BUN 15  CREATININE 1.27*  CALCIUM 8.5*  GLUCOSE 248*    Lipid Panel  No results found for: CHOL, TRIG, HDL, CHOLHDL, VLDL, LDLCALC  BNP (last 3 results) No results for input(s): BNP in the last 8760 hours.  HEMOGLOBIN A1C Lab Results  Component Value Date   HGBA1C 11.7 (H) 07/02/2019   MPG 289.09 07/02/2019     Medications Prior to Admission  Medication Sig Dispense Refill  . amLODipine (NORVASC) 5 MG tablet Take 5 mg by mouth 2 (two) times daily.    Marland Kitchen atorvastatin (LIPITOR) 40 MG tablet Take 40 mg by mouth daily.    Marland Kitchen glimepiride (AMARYL) 4 MG tablet Take 4 mg by mouth 2 (two) times daily. Breakfast and supper    . lisinopril-hydrochlorothiazide (ZESTORETIC) 20-12.5 MG tablet Take 1 tablet by mouth 2 (two) times daily.    . metFORMIN (GLUCOPHAGE-XR) 500 MG 24 hr tablet Take 1,000 mg by mouth 2 (two) times daily.    . metoprolol tartrate (LOPRESSOR) 50 MG tablet Take 50 mg by mouth 2 (two) times daily.    . Semaglutide, 1 MG/DOSE, 2 MG/1.5ML SOPN Inject 0.75 mLs into the skin once a week.        Current Facility-Administered Medications:  .  acetaminophen (TYLENOL) tablet 650 mg, 650 mg, Oral, Q4H PRN, Patwardhan, Manish J, MD .  atorvastatin (LIPITOR) tablet 40 mg,  40 mg, Oral, Daily, Patwardhan, Manish J, MD .  furosemide (LASIX) injection 40 mg, 40 mg, Intravenous, Q12H, Patwardhan, Manish J, MD, 40 mg at 07/02/19 2208 .  insulin aspart (novoLOG) injection 0-20 Units, 0-20 Units, Subcutaneous, TID WC, Patwardhan, Manish J, MD .  insulin aspart (novoLOG) injection 0-5 Units, 0-5 Units, Subcutaneous, QHS, Patwardhan, Manish J, MD, 3 Units at 07/02/19 2356 .  insulin aspart (novoLOG) injection 6 Units, 6 Units, Subcutaneous, TID WC, Patwardhan, Manish J, MD .  ondansetron (ZOFRAN) injection 4 mg, 4 mg, Intravenous, Q6H PRN, Patwardhan, Manish J, MD .  pneumococcal 23 valent vaccine (PNEUMOVAX-23) injection 0.5 mL, 0.5 mL, Intramuscular, Tomorrow-1000, Patwardhan, Manish J, MD   Today's Vitals   07/02/19 2300 07/03/19 0000 07/03/19 0100 07/03/19 0129  BP: (!) 164/95 (!) 142/94 (!) 139/113   Pulse: 89 89 74   Resp: (!) 27 (!) 28 19   Temp:      TempSrc:      SpO2: 94% 96% 93%   Weight:    (!) 178 kg  Height:    5\' 10"  (1.778 m)  PainSc:  0-No pain     Body mass index is 56.31 kg/m.  CARDIAC STUDIES:  EKG 07/02/2019: Sinus rhythm, left axis deviation, occasional PAC  Echocardiogram pending:  Assessment & Recommendations:  59 y.o. 46 male  with hypertension, type 2 DM, morbid obesity, admitted with shortness of breath, atrial flutter, VT.  Paroxysmal atrial flutter: Currently in sinus rhythm. CHA2DS2CVAsc score at least 2, likely higher-as suspecting heart  failure Continue heparin for now with possibility of requiring cardiac catheterization at some point.   VT: Monomorphic VT. Currently resolved. Obtain echocardiogram.  DM: SSI, and meal coverage Consult diabetes care coordinator  Hypertension: Uncontrolled. Will likely add [facility] tomorrow Elder Negus, MD 07/03/2019, 1:37 AM [facility] Pager: [phone] Office: [phone] If no answer: [phone]

## 2019-07-03 NOTE — Progress Notes (Signed)
  Echocardiogram 2D Echocardiogram has been performed.  Delcie Roch 07/03/2019, 4:45 PM

## 2019-07-03 NOTE — Progress Notes (Signed)
ANTICOAGULATION CONSULT NOTE - Initial Consult  Pharmacy Consult for heparin Indication: atrial fibrillation  No Known Allergies  Patient Measurements: Height: 5\' 10"  (177.8 cm) Weight: (!) 178 kg (392 lb 6.7 oz) IBW/kg (Calculated) : 73 Heparin Dosing Weight: 117 kg  Vital Signs: Temp: 98.3 F (36.8 C) (05/20 2100) Temp Source: Oral (05/20 2100) BP: 139/113 (05/21 0100) Pulse Rate: 74 (05/21 0100)  Labs: Recent Labs    07/02/19 2138  HGB 13.8  HCT 43.2  PLT 283  CREATININE 1.27*    Estimated Creatinine Clearance: 101.9 mL/min (A) (by C-G formula based on SCr of 1.27 mg/dL (H)).   Medical History: Past Medical History:  Diagnosis Date  . Diabetes mellitus without complication (HCC)   . Dyspnea   . Hypertension   . Obesity   . Pneumonia     Medications:  Scheduled:  . atorvastatin  40 mg Oral Daily  . furosemide  40 mg Intravenous Q12H  . insulin aspart  0-20 Units Subcutaneous TID WC  . insulin aspart  0-5 Units Subcutaneous QHS  . insulin aspart  6 Units Subcutaneous TID WC  . pneumococcal 23 valent vaccine  0.5 mL Intramuscular Tomorrow-1000    Assessment: 58 yom admitted with SOB and atrial fibrillation - not on AC PTA.   Hgb 13.8, plt 283. No s/sx of bleeding. Plan for heparin in case of need for future procedures.   Goal of Therapy:  Heparin level 0.3-0.7 units/ml Monitor platelets by anticoagulation protocol: Yes   Plan:  Give 5000 units bolus x 1 Start heparin infusion at 1650 units/hr Check anti-Xa level in 6 hours and daily while on heparin Continue to monitor H&H and platelets  2139, PharmD, BCCCP Clinical Pharmacist  07/03/2019 1:49 AM  Please check AMION for all [facility] Pharmacy phone numbers After 10:00 PM, call Main Pharmacy [phone]

## 2019-07-03 NOTE — Progress Notes (Signed)
ANTICOAGULATION CONSULT NOTE   Pharmacy Consult for heparin Indication: atrial fibrillation  No Known Allergies  Patient Measurements: Height: 5\' 10"  (177.8 cm) Weight: (!) 178 kg (392 lb 6.7 oz) IBW/kg (Calculated) : 73 Heparin Dosing Weight: 117 kg  Vital Signs: Temp: 97.8 F (36.6 C) (05/21 0630) Temp Source: Oral (05/21 0630) BP: 153/95 (05/21 0900) Pulse Rate: 99 (05/21 0900)  Labs: Recent Labs    07/02/19 2138 07/03/19 0351 07/03/19 0942  HGB 13.8 13.2  --   HCT 43.2 41.7  --   PLT 283 267  --   HEPARINUNFRC  --   --  0.17*  CREATININE 1.27* 1.21  --     Estimated Creatinine Clearance: 106.9 mL/min (by C-G formula based on SCr of 1.21 mg/dL).   Medical History: Past Medical History:  Diagnosis Date  . Diabetes mellitus without complication (HCC)   . Dyspnea   . Hypertension   . Obesity   . Pneumonia     Medications:  Scheduled:  . atorvastatin  40 mg Oral Daily  . Chlorhexidine Gluconate Cloth  6 each Topical Daily  . furosemide  40 mg Intravenous Q12H  . insulin aspart  0-20 Units Subcutaneous TID WC  . insulin aspart  0-5 Units Subcutaneous QHS  . insulin aspart  6 Units Subcutaneous TID WC  . metoprolol succinate  50 mg Oral Daily  . pneumococcal 23 valent vaccine  0.5 mL Intramuscular Tomorrow-1000    Assessment: 58 yom admitted with SOB and atrial fibrillation - not on AC PTA.   Hgb 13.2, plt 267. No s/sx of bleeding. Will continue heparin for now and follow up for additional procedures.   Initial heparin level below goal (0.17). Will adjust rate.   Goal of Therapy:  Heparin level 0.3-0.7 units/ml Monitor platelets by anticoagulation protocol: Yes   Plan:  Increase heparin infusion to 1950 units/hr Check anti-Xa level in 6 hours and daily while on heparin Continue to monitor H&H and platelets  07/05/19 PharmD., BCPS Clinical Pharmacist 07/03/2019 10:56 AM

## 2019-07-03 NOTE — Progress Notes (Signed)
  Amiodarone Drug - Drug Interaction Consult Note  Recommendations: On atorvastatin and heparin currently - no interactions at this time. Monitor vital signs and for future interactions.   Amiodarone is metabolized by the cytochrome P450 system and therefore has the potential to cause many drug interactions. Amiodarone has an average plasma half-life of 50 days (range 20 to 100 days).   There is potential for drug interactions to occur several weeks or months after stopping treatment and the onset of drug interactions may be slow after initiating amiodarone.   [x]  Statins: Increased risk of myopathy. Simvastatin- restrict dose to 20mg  daily. Other statins: counsel patients to report any muscle pain or weakness immediately.  [x]  Anticoagulants: Amiodarone can increase anticoagulant effect. Consider warfarin dose reduction. Patients should be monitored closely and the dose of anticoagulant altered accordingly, remembering that amiodarone levels take several weeks to stabilize.  []  Antiepileptics: Amiodarone can increase plasma concentration of phenytoin, the dose should be reduced. Note that small changes in phenytoin dose can result in large changes in levels. Monitor patient and counsel on signs of toxicity.  []  Beta blockers: increased risk of bradycardia, AV block and myocardial depression. Sotalol - avoid concomitant use.  []   Calcium channel blockers (diltiazem and verapamil): increased risk of bradycardia, AV block and myocardial depression.  []   Cyclosporine: Amiodarone increases levels of cyclosporine. Reduced dose of cyclosporine is recommended.  []  Digoxin dose should be halved when amiodarone is started.  []  Diuretics: increased risk of cardiotoxicity if hypokalemia occurs.  []  Oral hypoglycemic agents (glyburide, glipizide, glimepiride): increased risk of hypoglycemia. Patient's glucose levels should be monitored closely when initiating amiodarone therapy.   []  Drugs that  prolong the QT interval:  Torsades de pointes risk may be increased with concurrent use - avoid if possible.  Monitor QTc, also keep magnesium/potassium WNL if concurrent therapy can't be avoided. Antibiotics: e.g. fluoroquinolones, erythromycin. . Antiarrhythmics: e.g. quinidine, procainamide, disopyramide, sotalol. . Antipsychotics: e.g. phenothiazines, haloperidol.  . Lithium, tricyclic antidepressants, and methadone.  Thank You,   , PharmD, BCCCP Clinical Pharmacist  07/03/2019 2:07 AM  Please check AMION for all [facility] Pharmacy phone numbers After 10:00 PM, call Main Pharmacy [phone]

## 2019-07-04 ENCOUNTER — Encounter (HOSPITAL_COMMUNITY): Payer: Self-pay | Admitting: Cardiology

## 2019-07-04 DIAGNOSIS — I5020 Unspecified systolic (congestive) heart failure: Secondary | ICD-10-CM

## 2019-07-04 LAB — BRAIN NATRIURETIC PEPTIDE: B Natriuretic Peptide: 134.2 pg/mL — ABNORMAL HIGH (ref 0.0–100.0)

## 2019-07-04 LAB — ECHOCARDIOGRAM COMPLETE
Height: 70 in
Weight: 6278.7 oz

## 2019-07-04 LAB — BASIC METABOLIC PANEL
Anion gap: 15 (ref 5–15)
BUN: 14 mg/dL (ref 6–20)
CO2: 22 mmol/L (ref 22–32)
Calcium: 8.5 mg/dL — ABNORMAL LOW (ref 8.9–10.3)
Chloride: 99 mmol/L (ref 98–111)
Creatinine, Ser: 1.09 mg/dL (ref 0.61–1.24)
GFR calc Af Amer: >60 mL/min (ref >60)
GFR calc non Af Amer: >60 mL/min (ref >60)
Glucose, Bld: 227 mg/dL — ABNORMAL HIGH (ref 70–99)
Potassium: 3.5 mmol/L (ref 3.5–5.1)
Sodium: 136 mmol/L (ref 135–145)

## 2019-07-04 LAB — GLUCOSE, CAPILLARY
Glucose-Capillary: 135 mg/dL — ABNORMAL HIGH (ref 70–99)
Glucose-Capillary: 218 mg/dL — ABNORMAL HIGH (ref 70–99)
Glucose-Capillary: 253 mg/dL — ABNORMAL HIGH (ref 70–99)
Glucose-Capillary: 115 mg/dL — ABNORMAL HIGH (ref 70–99)

## 2019-07-04 LAB — CBC
HCT: 43.5 % (ref 39.0–52.0)
Hemoglobin: 14 g/dL (ref 13.0–17.0)
MCH: 30 pg (ref 26.0–34.0)
MCHC: 32.2 g/dL (ref 30.0–36.0)
MCV: 93.3 fL (ref 80.0–100.0)
Platelets: 269 10*3/uL (ref 150–400)
RBC: 4.66 MIL/uL (ref 4.22–5.81)
RDW: 13.2 % (ref 11.5–15.5)
WBC: 7.2 10*3/uL (ref 4.0–10.5)
nRBC: 0 % (ref 0.0–0.2)

## 2019-07-04 LAB — HEPARIN LEVEL (UNFRACTIONATED): Heparin Unfractionated: 0.45 IU/mL (ref 0.30–0.70)

## 2019-07-04 MED ORDER — POTASSIUM CHLORIDE CRYS ER 20 MEQ PO TBCR
40.0000 meq | EXTENDED_RELEASE_TABLET | Freq: Once | ORAL | Status: AC
  Administered 2019-07-04: 40 meq via ORAL
  Filled 2019-07-04: qty 2

## 2019-07-04 MED ORDER — SPIRONOLACTONE 25 MG PO TABS
50.0000 mg | ORAL_TABLET | Freq: Every day | ORAL | Status: DC
  Administered 2019-07-04 – 2019-07-07 (×4): 50 mg via ORAL
  Filled 2019-07-04 (×4): qty 2

## 2019-07-04 MED ORDER — ASPIRIN 81 MG PO CHEW
81.0000 mg | CHEWABLE_TABLET | Freq: Every day | ORAL | Status: DC
  Administered 2019-07-04 – 2019-07-07 (×3): 81 mg via ORAL
  Filled 2019-07-04 (×4): qty 1

## 2019-07-04 NOTE — Plan of Care (Signed)

## 2019-07-04 NOTE — Progress Notes (Signed)
ANTICOAGULATION CONSULT NOTE   Pharmacy Consult for heparin Indication: atrial fibrillation  No Known Allergies  Patient Measurements: Height: 5' 10"  (177.8 cm) Weight: (!) 177.2 kg (390 lb 10.5 oz) IBW/kg (Calculated) : 73 Heparin Dosing Weight: 117 kg  Vital Signs: Temp: 98.3 F (36.8 C) (05/22 0739) Temp Source: Oral (05/22 0739) BP: 158/105 (05/22 0739) Pulse Rate: 90 (05/22 0739)  Labs: Recent Labs    07/02/19 2138 07/02/19 2138 07/03/19 0351 07/03/19 0942 07/03/19 1937 07/04/19 0806  HGB 13.8   < > 13.2  --   --  14.0  HCT 43.2  --  41.7  --   --  43.5  PLT 283  --  267  --   --  269  HEPARINUNFRC  --   --   --  0.17* 0.28* 0.45  CREATININE 1.27*  --  1.21  --   --  1.09   < > = values in this interval not displayed.    Estimated Creatinine Clearance: 118.4 mL/min (by C-G formula based on SCr of 1.09 mg/dL).   Medical History: Past Medical History:  Diagnosis Date  . Diabetes mellitus without complication ([place])   . Dyspnea   . Hypertension   . Obesity   . Pneumonia     Medications:  Scheduled:  . aspirin  81 mg Oral Daily  . atorvastatin  40 mg Oral Daily  . Chlorhexidine Gluconate Cloth  6 each Topical Q0600  . furosemide  40 mg Intravenous Q12H  . insulin aspart  0-15 Units Subcutaneous TID WC  . insulin aspart  0-5 Units Subcutaneous QHS  . insulin detemir  18 Units Subcutaneous BID  . insulin starter kit- pen needles  1 kit Other Once  . metoprolol succinate  50 mg Oral Daily  . mupirocin ointment  1 application Nasal BID  . pneumococcal 23 valent vaccine  0.5 mL Intramuscular Tomorrow-1000  . spironolactone  50 mg Oral Daily    Assessment: 35 yom admitted with SOB and atrial fibrillation - not on AC PTA.   Heparin level is now within goal 0.45. No bleeding or issues with infusion per discussion with RN.  Hgb 14, plt 269 - stable.  Goal of Therapy:  Heparin level 0.3-0.7 units/ml Monitor platelets by anticoagulation protocol: Yes    Plan:  Continue heparin infusion at 2200 units/hr Monitor daily heparin level and CBC, s/sx bleeding  Vertis Kelch, PharmD, [facility] PGY2 Cardiology Pharmacy Resident Phone [phone] 07/04/2019       10:21 AM  Please check [email or web address] for unit-specific pharmacist phone numbers

## 2019-07-04 NOTE — Plan of Care (Signed)

## 2019-07-04 NOTE — Care Management (Signed)
Consult for uninsured patient with potential insulin needs.   Prices are approximate:  Reli On [employer] Brand Meter $16.00 100 Test Strips $18.00 200 Lancets ~$3.00  [facility] OP Pharmacies with MATCH Letter Levemir Insulin Flex Pen Novolog Insulin Flex Pen Novolog Mix Insulin Flex Pen Pen Needles for the above as well Lantus pen IS NOT available with MATCH, but the VIALS are.  Pens can be filled at [facility] OP pharmacies ONLY! They are open M-F. [facility] Pharmacy is open with limited hours on Saturday   Vials of insulin can be filled with MATCH letter at any [facility] pharmacy

## 2019-07-04 NOTE — Progress Notes (Signed)
Pt with increased agitation related to monitor alarms keeping him from sleeping. Pt is cursing at nursing staff and refusing lab draws. Will continue to monitor.

## 2019-07-04 NOTE — Progress Notes (Signed)
Subjective:  Breathing improved.  Patient was very agitated with frequent monitor alarms and interruptions at night.   Objective:  Vital Signs in the last 24 hours: Temp:  [97.1 F (36.2 C)-98.4 F (36.9 C)] 98.3 F (36.8 C) (05/22 0739) Pulse Rate:  [81-90] 90 (05/22 0739) Resp:  [18-30] 25 (05/22 0739) BP: (116-173)/(83-159) 158/105 (05/22 0739) SpO2:  [96 %-99 %] 96 % (05/22 0739) Weight:  [177.2 kg] 177.2 kg (05/22 0339)  Intake/Output from previous day: 05/21 0701 - 05/22 0700 In: 2414.1 [P.O.:1440; I.V.:974.1] Out: 3050 [Urine:3050]  Physical Exam  Constitutional: He is oriented to person, place, and time. He appears well-developed and well-nourished. No distress.  Morbidly obese  HENT:  Head: Normocephalic and atraumatic.  Eyes: Pupils are equal, round, and reactive to light. Conjunctivae are normal.  Neck: No JVD present.  Cardiovascular: Normal rate, regular rhythm and intact distal pulses.  Murmur heard. High-pitched blowing holosystolic murmur is present with a grade of 3/6 at the apex. Pulmonary/Chest: Effort normal and breath sounds normal. He has no wheezes. He has no rales.  Abdominal: Soft. Bowel sounds are normal. There is no rebound.  Musculoskeletal:        General: Edema (1-2+ b/l Improving) present.  Lymphadenopathy:    He has no cervical adenopathy.  Neurological: He is alert and oriented to person, place, and time. No cranial nerve deficit.  Skin: Skin is warm and dry.  Area of erythema on right shin with healed eschar on top. No open wound  Psychiatric: He has a normal mood and affect.  Nursing note and vitals reviewed.    Lab Results: BMP Recent Labs    07/02/19 2138 07/03/19 0351 07/04/19 0806  NA 138 138 136  K 3.8 3.4* 3.5  CL 101 103 99  CO2 23 26 22   GLUCOSE 248* 246* 227*  BUN 15 14 14   CREATININE 1.27* 1.21 1.09  CALCIUM 8.5* 8.5* 8.5*  GFRNONAA >60 >60 >60  GFRAA >60 >60 >60    CBC Recent Labs  Lab 07/02/19 2138  07/03/19 0351 07/04/19 0806  WBC 8.2   < > 7.2  RBC 4.61   < > 4.66  HGB 13.8   < > 14.0  HCT 43.2   < > 43.5  PLT 283   < > 269  MCV 93.7   < > 93.3  MCH 29.9   < > 30.0  MCHC 31.9   < > 32.2  RDW 13.5   < > 13.2  LYMPHSABS 1.5  --   --   MONOABS 0.6  --   --   EOSABS 0.0  --   --   BASOSABS 0.0  --   --    < > = values in this interval not displayed.    HEMOGLOBIN A1C Lab Results  Component Value Date   HGBA1C 11.7 (H) 07/02/2019   MPG 289.09 07/02/2019     Lipid Panel     Component Value Date/Time   CHOL 177 07/03/2019 0351   TRIG 96 07/03/2019 0351   HDL 30 (L) 07/03/2019 0351   CHOLHDL 5.9 07/03/2019 0351   VLDL 19 07/03/2019 0351   LDLCALC 128 (H) 07/03/2019 0351    Trop (Not HS) 0.13-->0.17 at [facility]  Cardiac Studies:  EKG 07/02/2019: Sinus rhythm with arrhythmia. LAFB.  Echocardiogram 07/03/2019: 1. Left ventricular ejection fraction, by estimation, is 20 to 25%. The  left ventricle has severely decreased function. The left ventricle  demonstrates global hypokinesis. The left  ventricular internal cavity size  was mildly dilated. Left ventricular  diastolic parameters are consistent with Grade III diastolic dysfunction  (restrictive).  2. Right ventricular systolic function is low normal. The right  ventricular size is normal.  3. Left atrial size was moderately dilated.  4. Moderate mitral regurgitaion. Moderate tricuspid regurgitation.  5. Moderate pulmonary hypertension. Estimated RVSP 47 mmHg.   Assessment & Recommendations:  59 y.o.Cacuasianmalewith hypertension, type 2 DM, morbid obesity, paroxysmal atrial flutter, sustained VT, new diagnosis HFrEF  HFrEF: New diagnosis. LVEF 20-25%. In acute decompensation.  He has diuresed well with IV lasix. Refusing IV lasix today.  On Metoprolol succinate 50 mg daily. Added spironolactone 50 mg daily. Will add Entresto 49-51 mg bid tomorrow.  Strict I/O, daily weights.  Etiology includes  ischemic or nonischemic cardiomyopathy. Right and left heart cath tentatively on Tuesday 5/25.  Sustained VT: One episode ay [facility] ED requiring cardioversion. Now resolved.  Suspect related to decompensated heart failure. No recurrence.  Stop IV amiodarone. Continue heart failure management.  Paroxysmal atrial flutter: Currently in sinus rhythm. CHA2DS2CVAsc score at least 3, annual stroke risk 3.6% Continue heparin for now with possibility of requiring cardiac catheterization this hospitalization.  DM: Novolog 18 U bid, meal coverage  Hypertension: Uncontrolled. Management as above  Uninsured patient with medication needs. Needs social work consult.  Anticipate discharge 5/26  Ethan Hamilton, M.D. [facility] [place] Pager: [phone] Office: [phone]

## 2019-07-05 LAB — BASIC METABOLIC PANEL
Anion gap: 9 (ref 5–15)
BUN: 13 mg/dL (ref 6–20)
CO2: 28 mmol/L (ref 22–32)
Calcium: 8.4 mg/dL — ABNORMAL LOW (ref 8.9–10.3)
Chloride: 101 mmol/L (ref 98–111)
Creatinine, Ser: 1.13 mg/dL (ref 0.61–1.24)
GFR calc Af Amer: >60 mL/min (ref >60)
GFR calc non Af Amer: >60 mL/min (ref >60)
Glucose, Bld: 91 mg/dL (ref 70–99)
Potassium: 3.5 mmol/L (ref 3.5–5.1)
Sodium: 138 mmol/L (ref 135–145)

## 2019-07-05 LAB — CBC
HCT: 43.6 % (ref 39.0–52.0)
Hemoglobin: 14 g/dL (ref 13.0–17.0)
MCH: 29.9 pg (ref 26.0–34.0)
MCHC: 32.1 g/dL (ref 30.0–36.0)
MCV: 93.2 fL (ref 80.0–100.0)
Platelets: 272 10*3/uL (ref 150–400)
RBC: 4.68 MIL/uL (ref 4.22–5.81)
RDW: 13.2 % (ref 11.5–15.5)
WBC: 7.2 10*3/uL (ref 4.0–10.5)
nRBC: 0 % (ref 0.0–0.2)

## 2019-07-05 LAB — GLUCOSE, CAPILLARY
Glucose-Capillary: 89 mg/dL (ref 70–99)
Glucose-Capillary: 210 mg/dL — ABNORMAL HIGH (ref 70–99)
Glucose-Capillary: 166 mg/dL — ABNORMAL HIGH (ref 70–99)
Glucose-Capillary: 233 mg/dL — ABNORMAL HIGH (ref 70–99)

## 2019-07-05 LAB — HEPARIN LEVEL (UNFRACTIONATED): Heparin Unfractionated: 0.49 IU/mL (ref 0.30–0.70)

## 2019-07-05 MED ORDER — POTASSIUM CHLORIDE CRYS ER 20 MEQ PO TBCR
40.0000 meq | EXTENDED_RELEASE_TABLET | Freq: Two times a day (BID) | ORAL | Status: AC
  Administered 2019-07-05 (×2): 40 meq via ORAL
  Filled 2019-07-05 (×2): qty 2

## 2019-07-05 NOTE — Progress Notes (Signed)
[place] for heparin Indication: atrial fibrillation  No Known Allergies  Patient Measurements: Height: 5' 10"  (177.8 cm) Weight: (!) 174.9 kg (385 lb 9.4 oz) IBW/kg (Calculated) : 73 Heparin Dosing Weight: 117 kg  Vital Signs: Temp: 98 F (36.7 C) (05/23 0411) Temp Source: Oral (05/23 0411) BP: 139/93 (05/23 0411) Pulse Rate: 78 (05/23 0411)  Labs: Recent Labs    07/03/19 0351 07/03/19 0351 07/03/19 0942 07/03/19 1937 07/04/19 0806 07/05/19 0220  HGB 13.2   < >  --   --  14.0 14.0  HCT 41.7  --   --   --  43.5 43.6  PLT 267  --   --   --  269 272  HEPARINUNFRC  --   --    < > 0.28* 0.45 0.49  CREATININE 1.21  --   --   --  1.09 1.13   < > = values in this interval not displayed.    Estimated Creatinine Clearance: 113.3 mL/min (by C-G formula based on SCr of 1.13 mg/dL).   Medical History: Past Medical History:  Diagnosis Date  . Diabetes mellitus without complication ([place])   . Dyspnea   . Hypertension   . Obesity   . Pneumonia     Medications:  Scheduled:  . aspirin  81 mg Oral Daily  . atorvastatin  40 mg Oral Daily  . Chlorhexidine Gluconate Cloth  6 each Topical Q0600  . furosemide  40 mg Intravenous Q12H  . insulin aspart  0-15 Units Subcutaneous TID WC  . insulin aspart  0-5 Units Subcutaneous QHS  . insulin detemir  18 Units Subcutaneous BID  . insulin starter kit- pen needles  1 kit Other Once  . metoprolol succinate  50 mg Oral Daily  . mupirocin ointment  1 application Nasal BID  . pneumococcal 23 valent vaccine  0.5 mL Intramuscular Tomorrow-1000  . spironolactone  50 mg Oral Daily    Assessment: 62 yom admitted with SOB and atrial fibrillation - not on AC PTA.   Heparin level is now within goal 0.49. No bleeding or issues with infusion noted.   Hgb 14, plt 272 - stable.  Goal of Therapy:  Heparin level 0.3-0.7 units/ml Monitor platelets by anticoagulation protocol: Yes   Plan:  Continue  heparin infusion at 2200 units/hr Monitor daily heparin level and CBC, s/sx bleeding  Vertis Kelch, PharmD, [facility] PGY2 Cardiology Pharmacy Resident Phone [phone] 07/05/2019       7:25 AM  Please check [email or web address] for unit-specific pharmacist phone numbers

## 2019-07-05 NOTE — H&P (View-Only) (Signed)
Subjective:  Breathing improved. Slept better last night  Objective:  Vital Signs in the last 24 hours: Temp:  [97.6 F (36.4 C)-98.7 F (37.1 C)] 98.1 F (36.7 C) (05/23 0738) Pulse Rate:  [78-111] 78 (05/23 0411) Resp:  [11-27] 17 (05/23 0411) BP: (123-164)/(69-113) 140/82 (05/23 0738) SpO2:  [94 %-99 %] 99 % (05/23 0411) Weight:  [174.9 kg] 174.9 kg (05/23 0523)  Intake/Output from previous day: 05/22 0701 - 05/23 0700 In: 1776.1 [P.O.:1120; I.V.:656.1] Out: 1860 [Urine:1860]  Physical Exam  Constitutional: He is oriented to person, place, and time. He appears well-developed and well-nourished. No distress.  Morbidly obese  HENT:  Head: Normocephalic and atraumatic.  Eyes: Pupils are equal, round, and reactive to light. Conjunctivae are normal.  Neck: No JVD present.  Cardiovascular: Normal rate, regular rhythm and intact distal pulses.  Murmur heard. High-pitched blowing holosystolic murmur is present with a grade of 3/6 at the apex. Pulmonary/Chest: Effort normal and breath sounds normal. He has no wheezes. He has no rales.  Abdominal: Soft. Bowel sounds are normal. There is no rebound.  Musculoskeletal:        General: Edema (1+, improving) present.  Lymphadenopathy:    He has no cervical adenopathy.  Neurological: He is alert and oriented to person, place, and time. No cranial nerve deficit.  Skin: Skin is warm and dry.  Area of erythema on right shin with healed eschar on top. No open wound  Psychiatric: He has a normal mood and affect.  Nursing note and vitals reviewed.    Lab Results: BMP Recent Labs    07/03/19 0351 07/04/19 0806 07/05/19 0220  NA 138 136 138  K 3.4* 3.5 3.5  CL 103 99 101  CO2 26 22 28  GLUCOSE 246* 227* 91  BUN 14 14 13  CREATININE 1.21 1.09 1.13  CALCIUM 8.5* 8.5* 8.4*  GFRNONAA >60 >60 >60  GFRAA >60 >60 >60    CBC Recent Labs  Lab 07/02/19 2138 07/03/19 0351 07/05/19 0220  WBC 8.2   < > 7.2  RBC 4.61   < > 4.68   HGB 13.8   < > 14.0  HCT 43.2   < > 43.6  PLT 283   < > 272  MCV 93.7   < > 93.2  MCH 29.9   < > 29.9  MCHC 31.9   < > 32.1  RDW 13.5   < > 13.2  LYMPHSABS 1.5  --   --   MONOABS 0.6  --   --   EOSABS 0.0  --   --   BASOSABS 0.0  --   --    < > = values in this interval not displayed.    HEMOGLOBIN A1C Lab Results  Component Value Date   HGBA1C 11.7 (H) 07/02/2019   MPG 289.09 07/02/2019     Lipid Panel     Component Value Date/Time   CHOL 177 07/03/2019 0351   TRIG 96 07/03/2019 0351   HDL 30 (L) 07/03/2019 0351   CHOLHDL 5.9 07/03/2019 0351   VLDL 19 07/03/2019 0351   LDLCALC 128 (H) 07/03/2019 0351    Trop (Not HS) 0.13-->0.17 at   Cardiac Studies:  EKG 07/02/2019: Sinus rhythm with arrhythmia. LAFB.  Echocardiogram 07/03/2019: 1. Left ventricular ejection fraction, by estimation, is 20 to 25%. The  left ventricle has severely decreased function. The left ventricle  demonstrates global hypokinesis. The left ventricular internal cavity size  was mildly dilated. Left ventricular    diastolic parameters are consistent with Grade III diastolic dysfunction  (restrictive).  2. Right ventricular systolic function is low normal. The right  ventricular size is normal.  3. Left atrial size was moderately dilated.  4. Moderate mitral regurgitaion. Moderate tricuspid regurgitation.  5. Moderate pulmonary hypertension. Estimated RVSP 47 mmHg.   Assessment & Recommendations:  59 y.o.Cacuasianmalewith hypertension, type 2 DM, morbid obesity, paroxysmal atrial flutter, sustained VT, new diagnosis HFrEF  HFrEF: New diagnosis. LVEF 20-25%. In acute decompensation, improving. On Metoprolol succinate 50 mg daily, spironolactone 50 mg daily, lasix 40 mg IV bid Will add Entresto 49-51 mg bid tomorrow 5/24. Strict I/O, daily weights.  Etiology includes ischemic or nonischemic cardiomyopathy. Right and left heart cath tentatively on Monday 5/24.  Sustained  VT: One episode ay [facility] ED requiring cardioversion. Now resolved.  Suspect related to decompensated heart failure. No recurrence.   Paroxysmal atrial flutter: Currently in sinus rhythm. CHA2DS2CVAsc score at least 3, annual stroke risk 3.6% Continue heparin for now with possibility of requiring cardiac catheterization this hospitalization. Switch to Eliquis after cath  DM: Novolog 18 U bid, meal coverage  Hypertension: Better controlled.  Uninsured patient with medication needs. Needs social work consult.  Anticipate discharge 5/26 or sooner  Elder Negus, M.D. [facility] Pager: [phone] Office: [phone]

## 2019-07-05 NOTE — Progress Notes (Signed)
Subjective:  Breathing improved. Slept better last night  Objective:  Vital Signs in the last 24 hours: Temp:  [97.6 F (36.4 C)-98.7 F (37.1 C)] 98.1 F (36.7 C) (05/23 0738) Pulse Rate:  [78-111] 78 (05/23 0411) Resp:  [11-27] 17 (05/23 0411) BP: (123-164)/(69-113) 140/82 (05/23 0738) SpO2:  [94 %-99 %] 99 % (05/23 0411) Weight:  [174.9 kg] 174.9 kg (05/23 0523)  Intake/Output from previous day: 05/22 0701 - 05/23 0700 In: 1776.1 [P.O.:1120; I.V.:656.1] Out: 1860 [Urine:1860]  Physical Exam  Constitutional: He is oriented to person, place, and time. He appears well-developed and well-nourished. No distress.  Morbidly obese  HENT:  Head: Normocephalic and atraumatic.  Eyes: Pupils are equal, round, and reactive to light. Conjunctivae are normal.  Neck: No JVD present.  Cardiovascular: Normal rate, regular rhythm and intact distal pulses.  Murmur heard. High-pitched blowing holosystolic murmur is present with a grade of 3/6 at the apex. Pulmonary/Chest: Effort normal and breath sounds normal. He has no wheezes. He has no rales.  Abdominal: Soft. Bowel sounds are normal. There is no rebound.  Musculoskeletal:        General: Edema (1+, improving) present.  Lymphadenopathy:    He has no cervical adenopathy.  Neurological: He is alert and oriented to person, place, and time. No cranial nerve deficit.  Skin: Skin is warm and dry.  Area of erythema on right shin with healed eschar on top. No open wound  Psychiatric: He has a normal mood and affect.  Nursing note and vitals reviewed.    Lab Results: BMP Recent Labs    07/03/19 0351 07/04/19 0806 07/05/19 0220  NA 138 136 138  K 3.4* 3.5 3.5  CL 103 99 101  CO2 26 22 28   GLUCOSE 246* 227* 91  BUN 14 14 13   CREATININE 1.21 1.09 1.13  CALCIUM 8.5* 8.5* 8.4*  GFRNONAA >60 >60 >60  GFRAA >60 >60 >60    CBC Recent Labs  Lab 07/02/19 2138 07/03/19 0351 07/05/19 0220  WBC 8.2   < > 7.2  RBC 4.61   < > 4.68   HGB 13.8   < > 14.0  HCT 43.2   < > 43.6  PLT 283   < > 272  MCV 93.7   < > 93.2  MCH 29.9   < > 29.9  MCHC 31.9   < > 32.1  RDW 13.5   < > 13.2  LYMPHSABS 1.5  --   --   MONOABS 0.6  --   --   EOSABS 0.0  --   --   BASOSABS 0.0  --   --    < > = values in this interval not displayed.    HEMOGLOBIN A1C Lab Results  Component Value Date   HGBA1C 11.7 (H) 07/02/2019   MPG 289.09 07/02/2019     Lipid Panel     Component Value Date/Time   CHOL 177 07/03/2019 0351   TRIG 96 07/03/2019 0351   HDL 30 (L) 07/03/2019 0351   CHOLHDL 5.9 07/03/2019 0351   VLDL 19 07/03/2019 0351   LDLCALC 128 (H) 07/03/2019 0351    Trop (Not HS) 0.13-->0.17 at [facility]  Cardiac Studies:  EKG 07/02/2019: Sinus rhythm with arrhythmia. LAFB.  Echocardiogram 07/03/2019: 1. Left ventricular ejection fraction, by estimation, is 20 to 25%. The  left ventricle has severely decreased function. The left ventricle  demonstrates global hypokinesis. The left ventricular internal cavity size  was mildly dilated. Left ventricular  diastolic parameters are consistent with Grade III diastolic dysfunction  (restrictive).  2. Right ventricular systolic function is low normal. The right  ventricular size is normal.  3. Left atrial size was moderately dilated.  4. Moderate mitral regurgitaion. Moderate tricuspid regurgitation.  5. Moderate pulmonary hypertension. Estimated RVSP 47 mmHg.   Assessment & Recommendations:  59 y.o.Cacuasianmalewith hypertension, type 2 DM, morbid obesity, paroxysmal atrial flutter, sustained VT, new diagnosis HFrEF  HFrEF: New diagnosis. LVEF 20-25%. In acute decompensation, improving. On Metoprolol succinate 50 mg daily, spironolactone 50 mg daily, lasix 40 mg IV bid Will add Entresto 49-51 mg bid tomorrow 5/24. Strict I/O, daily weights.  Etiology includes ischemic or nonischemic cardiomyopathy. Right and left heart cath tentatively on Monday 5/24.  Sustained  VT: One episode ay [facility] requiring cardioversion. Now resolved.  Suspect related to decompensated heart failure. No recurrence.   Paroxysmal atrial flutter: Currently in sinus rhythm. CHA2DS2CVAsc score at least 3, annual stroke risk 3.6% Continue heparin for now with possibility of requiring cardiac catheterization this hospitalization. Switch to Eliquis after cath  DM: Novolog 18 U bid, meal coverage  Hypertension: Better controlled.  Uninsured patient with medication needs. Needs social work consult.  Anticipate discharge 5/26 or sooner  Elder Negus, M.D. [facility] Pager: [phone] Office: [phone]

## 2019-07-06 ENCOUNTER — Encounter (HOSPITAL_COMMUNITY)
Admission: AD | Disposition: A | Discharge status: Home / Self Care | Payer: Self-pay | Source: Other Acute Inpatient Hospital | Attending: Cardiology

## 2019-07-06 ENCOUNTER — Other Ambulatory Visit: Payer: Self-pay

## 2019-07-06 HISTORY — PX: LEFT HEART CATH AND CORONARY ANGIOGRAPHY: CATH118249

## 2019-07-06 HISTORY — PX: INTRAVASCULAR PRESSURE WIRE/FFR STUDY: CATH118243

## 2019-07-06 LAB — BASIC METABOLIC PANEL
Anion gap: 13 (ref 5–15)
BUN: 12 mg/dL (ref 6–20)
CO2: 29 mmol/L (ref 22–32)
Calcium: 8.7 mg/dL — ABNORMAL LOW (ref 8.9–10.3)
Chloride: 99 mmol/L (ref 98–111)
Creatinine, Ser: 1.15 mg/dL (ref 0.61–1.24)
GFR calc Af Amer: >60 mL/min (ref >60)
GFR calc non Af Amer: >60 mL/min (ref >60)
Glucose, Bld: 183 mg/dL — ABNORMAL HIGH (ref 70–99)
Potassium: 3.7 mmol/L (ref 3.5–5.1)
Sodium: 141 mmol/L (ref 135–145)

## 2019-07-06 LAB — CBC
HCT: 43.7 % (ref 39.0–52.0)
Hemoglobin: 14.1 g/dL (ref 13.0–17.0)
MCH: 30.1 pg (ref 26.0–34.0)
MCHC: 32.3 g/dL (ref 30.0–36.0)
MCV: 93.2 fL (ref 80.0–100.0)
Platelets: 278 10*3/uL (ref 150–400)
RBC: 4.69 MIL/uL (ref 4.22–5.81)
RDW: 13.2 % (ref 11.5–15.5)
WBC: 6.8 10*3/uL (ref 4.0–10.5)
nRBC: 0 % (ref 0.0–0.2)

## 2019-07-06 LAB — HEPARIN LEVEL (UNFRACTIONATED): Heparin Unfractionated: 0.51 IU/mL (ref 0.30–0.70)

## 2019-07-06 LAB — GLUCOSE, CAPILLARY
Glucose-Capillary: 133 mg/dL — ABNORMAL HIGH (ref 70–99)
Glucose-Capillary: 125 mg/dL — ABNORMAL HIGH (ref 70–99)
Glucose-Capillary: 212 mg/dL — ABNORMAL HIGH (ref 70–99)
Glucose-Capillary: 187 mg/dL — ABNORMAL HIGH (ref 70–99)

## 2019-07-06 LAB — POCT ACTIVATED CLOTTING TIME
Activated Clotting Time: 197 seconds
Activated Clotting Time: 202 seconds

## 2019-07-06 SURGERY — LEFT HEART CATH AND CORONARY ANGIOGRAPHY
Anesthesia: LOCAL

## 2019-07-06 MED ORDER — SODIUM CHLORIDE 0.9% FLUSH: 3.0000 mL | Freq: Two times a day (BID) | INTRAVENOUS | Status: DC

## 2019-07-06 MED ORDER — HYDRALAZINE HCL 20 MG/ML IJ SOLN: 10.0000 mg | INTRAMUSCULAR | Status: AC | PRN

## 2019-07-06 MED ORDER — MIDAZOLAM HCL 2 MG/2ML IJ SOLN
INTRAMUSCULAR | Status: DC | PRN
  Administered 2019-07-06: 2 mg via INTRAVENOUS

## 2019-07-06 MED ORDER — SODIUM CHLORIDE 0.9% FLUSH: 3.0000 mL | INTRAVENOUS | Status: DC | PRN

## 2019-07-06 MED ORDER — HEPARIN (PORCINE) IN NACL 1000-0.9 UT/500ML-% IV SOLN
INTRAVENOUS | Status: DC | PRN
  Administered 2019-07-06 (×2): 500 mL

## 2019-07-06 MED ORDER — LIDOCAINE HCL (PF) 1 % IJ SOLN
INTRAMUSCULAR | Status: DC | PRN
  Administered 2019-07-06 (×2): 2 mL via INTRADERMAL

## 2019-07-06 MED ORDER — DAPAGLIFLOZIN PROPANEDIOL 10 MG PO TABS
10.0000 mg | ORAL_TABLET | Freq: Every day | ORAL | 2 refills | Status: DC
Start: 2019-07-06 — End: 2019-08-11

## 2019-07-06 MED ORDER — FENTANYL CITRATE (PF) 100 MCG/2ML IJ SOLN
INTRAMUSCULAR | Status: AC
  Filled 2019-07-06: qty 2

## 2019-07-06 MED ORDER — NITROGLYCERIN 1 MG/10 ML FOR IR/CATH LAB
INTRA_ARTERIAL | Status: DC | PRN
  Administered 2019-07-06: 200 ug via INTRACORONARY

## 2019-07-06 MED ORDER — ASPIRIN 81 MG PO CHEW
81.0000 mg | CHEWABLE_TABLET | ORAL | Status: AC
  Administered 2019-07-06: 81 mg via ORAL
  Filled 2019-07-06: qty 1

## 2019-07-06 MED ORDER — SODIUM CHLORIDE 0.9 % IV SOLN: 250.0000 mL | INTRAVENOUS | Status: DC | PRN

## 2019-07-06 MED ORDER — LABETALOL HCL 5 MG/ML IV SOLN: 10.0000 mg | INTRAVENOUS | Status: AC | PRN

## 2019-07-06 MED ORDER — METFORMIN HCL ER 500 MG PO TB24: 1000.0000 mg | ORAL_TABLET | Freq: Two times a day (BID) | ORAL | 2 refills | Status: DC

## 2019-07-06 MED ORDER — ATORVASTATIN CALCIUM 40 MG PO TABS: 40.0000 mg | ORAL_TABLET | Freq: Every day | ORAL | 2 refills | Status: AC

## 2019-07-06 MED ORDER — HEPARIN SODIUM (PORCINE) 1000 UNIT/ML IJ SOLN
INTRAMUSCULAR | Status: AC
  Filled 2019-07-06: qty 1

## 2019-07-06 MED ORDER — FUROSEMIDE 40 MG PO TABS
40.0000 mg | ORAL_TABLET | Freq: Two times a day (BID) | ORAL | Status: DC
  Administered 2019-07-06 – 2019-07-07 (×2): 40 mg via ORAL
  Filled 2019-07-06 (×2): qty 1

## 2019-07-06 MED ORDER — ACETAMINOPHEN 325 MG PO TABS: 650.0000 mg | ORAL_TABLET | ORAL | Status: DC | PRN

## 2019-07-06 MED ORDER — SPIRONOLACTONE 50 MG PO TABS: 50.0000 mg | ORAL_TABLET | Freq: Every day | ORAL | 2 refills | Status: DC

## 2019-07-06 MED ORDER — FUROSEMIDE 40 MG PO TABS: 40.0000 mg | ORAL_TABLET | Freq: Two times a day (BID) | ORAL | 2 refills | Status: DC

## 2019-07-06 MED ORDER — IOHEXOL 350 MG/ML SOLN
INTRAVENOUS | Status: DC | PRN
  Administered 2019-07-06: 110 mL via INTRA_ARTERIAL

## 2019-07-06 MED ORDER — APIXABAN 5 MG PO TABS
5.0000 mg | ORAL_TABLET | Freq: Two times a day (BID) | ORAL | Status: DC
  Administered 2019-07-06 – 2019-07-07 (×2): 5 mg via ORAL
  Filled 2019-07-06 (×3): qty 1

## 2019-07-06 MED ORDER — ASPIRIN 81 MG PO CHEW: 81.0000 mg | CHEWABLE_TABLET | ORAL | Status: DC

## 2019-07-06 MED ORDER — VERAPAMIL HCL 2.5 MG/ML IV SOLN
INTRAVENOUS | Status: DC | PRN
  Administered 2019-07-06: 10 mL via INTRA_ARTERIAL

## 2019-07-06 MED ORDER — SODIUM CHLORIDE 0.9% FLUSH
3.0000 mL | Freq: Two times a day (BID) | INTRAVENOUS | Status: DC
  Administered 2019-07-06 – 2019-07-07 (×3): 3 mL via INTRAVENOUS

## 2019-07-06 MED ORDER — VERAPAMIL HCL 2.5 MG/ML IV SOLN
INTRAVENOUS | Status: AC
  Filled 2019-07-06: qty 2

## 2019-07-06 MED ORDER — SACUBITRIL-VALSARTAN 49-51 MG PO TABS: 1.0000 | ORAL_TABLET | Freq: Two times a day (BID) | ORAL | 2 refills | Status: DC

## 2019-07-06 MED ORDER — SACUBITRIL-VALSARTAN 49-51 MG PO TABS
1.0000 | ORAL_TABLET | Freq: Two times a day (BID) | ORAL | Status: DC
  Administered 2019-07-06 – 2019-07-07 (×3): 1 via ORAL
  Filled 2019-07-06 (×4): qty 1

## 2019-07-06 MED ORDER — NITROGLYCERIN 1 MG/10 ML FOR IR/CATH LAB
INTRA_ARTERIAL | Status: AC
  Filled 2019-07-06: qty 10

## 2019-07-06 MED ORDER — ASPIRIN 81 MG PO CHEW: 81.0000 mg | CHEWABLE_TABLET | Freq: Every day | ORAL | 2 refills | Status: DC

## 2019-07-06 MED ORDER — SODIUM CHLORIDE 0.9 % IV SOLN: INTRAVENOUS | Status: DC

## 2019-07-06 MED ORDER — LIDOCAINE HCL (PF) 1 % IJ SOLN
INTRAMUSCULAR | Status: AC
  Filled 2019-07-06: qty 30

## 2019-07-06 MED ORDER — MIDAZOLAM HCL 2 MG/2ML IJ SOLN
INTRAMUSCULAR | Status: AC
  Filled 2019-07-06: qty 2

## 2019-07-06 MED ORDER — DAPAGLIFLOZIN PROPANEDIOL 5 MG PO TABS
10.0000 mg | ORAL_TABLET | Freq: Every day | ORAL | Status: DC
  Administered 2019-07-06 – 2019-07-07 (×2): 10 mg via ORAL
  Filled 2019-07-06 (×4): qty 2
  Filled 2019-07-06: qty 1
  Filled 2019-07-06: qty 2

## 2019-07-06 MED ORDER — HEPARIN (PORCINE) IN NACL 1000-0.9 UT/500ML-% IV SOLN
INTRAVENOUS | Status: AC
  Filled 2019-07-06: qty 1000

## 2019-07-06 MED ORDER — METOPROLOL SUCCINATE ER 50 MG PO TB24: 50.0000 mg | ORAL_TABLET | Freq: Every day | ORAL | 2 refills | Status: DC

## 2019-07-06 MED ORDER — HEPARIN SODIUM (PORCINE) 1000 UNIT/ML IJ SOLN
INTRAMUSCULAR | Status: DC | PRN
  Administered 2019-07-06 (×2): 6000 [IU] via INTRAVENOUS

## 2019-07-06 MED ORDER — ONDANSETRON HCL 4 MG/2ML IJ SOLN: 4.0000 mg | Freq: Four times a day (QID) | INTRAMUSCULAR | Status: DC | PRN

## 2019-07-06 MED ORDER — APIXABAN 5 MG PO TABS: 5.0000 mg | ORAL_TABLET | Freq: Two times a day (BID) | ORAL | 2 refills | Status: DC

## 2019-07-06 MED ORDER — FENTANYL CITRATE (PF) 100 MCG/2ML IJ SOLN
INTRAMUSCULAR | Status: DC | PRN
  Administered 2019-07-06 (×2): 50 ug via INTRAVENOUS

## 2019-07-06 MED ORDER — POTASSIUM CHLORIDE CRYS ER 20 MEQ PO TBCR
40.0000 meq | EXTENDED_RELEASE_TABLET | Freq: Once | ORAL | Status: AC
  Administered 2019-07-06: 40 meq via ORAL
  Filled 2019-07-06: qty 2

## 2019-07-06 MED FILL — ENTRESTO 49 MG-51 MG TABLET: 49-51 | 30 days supply | Qty: 60 | Fill #0

## 2019-07-06 MED FILL — SPIRONOLACTONE 50 MG TAB: 50 | 30 days supply | Qty: 30 | Fill #0

## 2019-07-06 MED FILL — ELIQUIS 5 MG TABLET: 5 | 30 days supply | Qty: 60 | Fill #0

## 2019-07-06 MED FILL — METFORMIN HCL ER 500 MG TB2: 500 | 30 days supply | Qty: 120 | Fill #0

## 2019-07-06 MED FILL — METOPROLOL SUCCINATE ER 50: 50 | 30 days supply | Qty: 30 | Fill #0

## 2019-07-06 MED FILL — FUROSEMIDE 40 MG TABLET: 40 | 30 days supply | Qty: 60 | Fill #0

## 2019-07-06 MED FILL — ATORVASTATIN CALCIUM 40 MG: 40 | 30 days supply | Qty: 30 | Fill #0

## 2019-07-06 MED FILL — ASPIRIN LOW DOSE 81 MG CHEW: 81 | 30 days supply | Qty: 30 | Fill #0

## 2019-07-06 SURGICAL SUPPLY — 18 items
CATH 5FR JL3.5 JR4 ANG PIG MP (CATHETERS) ×1 IMPLANT
CATH BALLN WEDGE 5F 110CM (CATHETERS) ×1 IMPLANT
CATH INFINITI 5FR JL4 (CATHETERS) ×1 IMPLANT
CATH LAUNCHER 6FR EBU 3.75 (CATHETERS) ×1 IMPLANT
CATH LAUNCHER 6FR EBU 4 (CATHETERS) ×1 IMPLANT
DEVICE RAD COMP TR BAND LRG (VASCULAR PRODUCTS) ×1 IMPLANT
GLIDESHEATH SLEND A-KIT 6F 22G (SHEATH) ×1 IMPLANT
GLIDESHEATH SLEND SS 6F .021 (SHEATH) ×1 IMPLANT
GUIDEWIRE INQWIRE 1.5J.035X260 (WIRE) IMPLANT
GUIDEWIRE PRESSURE X 175 (WIRE) ×1 IMPLANT
HOVERMATT SINGLE USE (MISCELLANEOUS) ×1 IMPLANT
INQWIRE 1.5J .035X260CM (WIRE) ×2
KIT HEART LEFT (KITS) ×2 IMPLANT
KIT HEMO VALVE WATCHDOG (MISCELLANEOUS) ×1 IMPLANT
PACK CARDIAC CATHETERIZATION (CUSTOM PROCEDURE TRAY) ×2 IMPLANT
SHEATH GLIDE SLENDER 4/5FR (SHEATH) ×1 IMPLANT
TRANSDUCER W/STOPCOCK (MISCELLANEOUS) ×2 IMPLANT
TUBING CIL FLEX 10 FLL-RA (TUBING) ×2 IMPLANT

## 2019-07-06 NOTE — Plan of Care (Signed)

## 2019-07-06 NOTE — Progress Notes (Signed)
[place] for heparin to apixaban Indication: atrial fibrillation  No Known Allergies  Patient Measurements: Height: _0  (177.8 cm) Weight: (!) 172.2 kg (379 lb 10.1 oz) IBW/kg (Calculated) : 73 Heparin Dosing Weight: 117 kg  Vital Signs: Temp: 98.1 F (36.7 C) (05/24 0703) Temp Source: Oral (05/24 0700) BP: 159/106 (05/24 0855) Pulse Rate: 79 (05/24 0905)  Labs: Recent Labs    07/04/19 0806 07/04/19 0806 07/05/19 0220 07/06/19 0236  HGB 14.0   < > 14.0 14.1  HCT 43.5  --  43.6 43.7  PLT 269  --  272 278  HEPARINUNFRC 0.45  --  0.49 0.51  CREATININE 1.09  --  1.13 1.15   < > = values in this interval not displayed.    Estimated Creatinine Clearance: 110.3 mL/min (by C-G formula based on SCr of 1.15 mg/dL).   Medical History: Past Medical History:  Diagnosis Date  . Diabetes mellitus without complication ([place])   . Dyspnea   . Hypertension   . Obesity   . Pneumonia     Medications:  Scheduled:  . aspirin  81 mg Oral Daily  . atorvastatin  40 mg Oral Daily  . Chlorhexidine Gluconate Cloth  6 each Topical Q0600  . furosemide  40 mg Intravenous Q12H  . insulin aspart  0-15 Units Subcutaneous TID WC  . insulin aspart  0-5 Units Subcutaneous QHS  . insulin detemir  18 Units Subcutaneous BID  . insulin starter kit- pen needles  1 kit Other Once  . metoprolol succinate  50 mg Oral Daily  . mupirocin ointment  1 application Nasal BID  . pneumococcal 23 valent vaccine  0.5 mL Intramuscular Tomorrow-1000  . potassium chloride  40 mEq Oral Once  . sacubitril-valsartan  1 tablet Oral BID  . sodium chloride flush  3 mL Intravenous Q12H  . spironolactone  50 mg Oral Daily    Assessment: 55 yom admitted with SOB and atrial fibrillation - not on AC PTA. Pt started on IV heparin with need for ischemic evaluation in cath lab, now pharmacy asked to switch to apixaban. Pt s/p LHC this morning so will begin apixaban this  evening.   Goal of Therapy:  Heparin level 0.3-0.7 units/ml Monitor platelets by anticoagulation protocol: Yes   Plan:  -Apixaban 20m BID starting tonight  MArrie Senate PharmD, BCPS Clinical Pharmacist [phone] check AMION for all MRotenumbers 07/06/2019

## 2019-07-06 NOTE — Progress Notes (Signed)
PT Cancellation Note  Patient Details Name: Ethan Hamilton MRN: [id number] DOB: 09-19-1960   Cancelled Treatment:    Reason Eval/Treat Not Completed: Patient not medically ready  Per RN, working on getting TR band removed--will be another 1.5 hours. And she noted pt will not be allowed to use his UE until 24 hours after procedure (which was completed ~10:00 a.m.)  Will plan to see 5/25 after 10 a.m. to allow full assessment of mobility, including use of RUE.   Jerolyn Center, PT Pager [phone] Zena Amos 07/06/2019, 12:57 PM

## 2019-07-06 NOTE — Discharge Instructions (Signed)

## 2019-07-06 NOTE — Progress Notes (Signed)
RN received call from MD regarding status of social work consult for patient needs for d/c. RN advise MD that case management notified during morning rounds but would follow up with CSW.  Spoke with Letha Cape, CSW who is aware and is working on patient needs.

## 2019-07-06 NOTE — Plan of Care (Signed)
  Problem: Education: Goal: Knowledge of General Education information will improve Description: Including pain rating scale, medication(s)/side effects and non-pharmacologic comfort measures Outcome: Progressing   Problem: Clinical Measurements: Goal: Ability to maintain clinical measurements within normal limits will improve Outcome: Progressing   Problem: Clinical Measurements: Goal: Respiratory complications will improve Outcome: Progressing   Problem: Clinical Measurements: Goal: Cardiovascular complication will be avoided Outcome: Progressing   

## 2019-07-06 NOTE — Progress Notes (Signed)
Transitions of Care Pharmacist Note  Ethan Hamilton is a 59 y.o. male that has been diagnosed with A Fib and will be prescribed Eliquis (apixaban) at discharge.   Patient Education: I provided the following education on 07/06/19 to the patient: How to take the medication Described what the medication is Signs of bleeding Signs/symptoms of VTE and stroke  Answered their questions  Discharge Medications Plan: The patient wants to have their discharge medications filled by the Transitions of Care pharmacy rather than their usual pharmacy.  The discharge orders pharmacy has been changed to the Transitions of Care pharmacy, the patient will receive a phone call regarding co-pay, and their medications will be delivered by the Transitions of Care pharmacy.    Thank you,   Lulu Riding, PharmD PGY1 Pharmacy Resident  Please check AMION for all [facility] Pharmacy phone numbers After 10:00 PM, call Main Pharmacy [phone]  Jul 06, 2019

## 2019-07-06 NOTE — Progress Notes (Addendum)
Hard copy of covid test results noted in chart. Was done at [facility] on 07/02/19. Results was negative.  7579- Refused right groin to be clipped per MD order. Allowed this nurse and tech to weigh on stand up scale and to wipe down with chg wipes.   7282- IV team in room to assist with IV start due to difficult stick.   0700- Patient Mews fired yellow. Respirations increased due to up walking to bathroom. After resting, rechecked resp went from 27 to 23.

## 2019-07-06 NOTE — Progress Notes (Signed)
OT Cancellation Note  Patient Details Name: Ethan Hamilton MRN: [id number] DOB: 05-19-1960   Cancelled Treatment:    Reason Eval/Treat Not Completed: Medical issues which prohibited therapy(Pt still having TR band taken out; not to use UE until 10am) RN was not going to be done removing TR band until ~2:30 or 3:00p AND pt could not use UE until 10 a.m. on Tues. Defer OT eval until 5/25 after 10am.  Flora Lipps, OTR/L Acute Rehabilitation Services Pager: [phone] Office: [phone]    Corinn Stoltzfus C 07/06/2019, 2:06 PM

## 2019-07-06 NOTE — Progress Notes (Addendum)
Patient is refusing lab draws this morning.   32- Explained to patient that if he does not get his blood drawn then the procedure that he will be going for this morning may get cancelled and also explained that he is on a blood thinner and it is dangerous for him not to get his levels checked per MD order. Patient appears agitated and sticks out his arm and says "she just came in saying, I'm getting some blood." "they didn't explain to me all that." This nurse stood at door while phlebotomist drew labs from patient's right hand.

## 2019-07-06 NOTE — Interval H&P Note (Signed)
History and Physical Interval Note:  07/06/2019 7:40 AM  Jettie Booze  has presented today for surgery, with the diagnosis of SOB.  The various methods of treatment have been discussed with the patient and family. After consideration of risks, benefits and other options for treatment, the patient has consented to  Procedure(s): RIGHT/LEFT HEART CATH AND CORONARY ANGIOGRAPHY (N/A) as a surgical intervention.  The patient's history has been reviewed, patient examined, no change in status, stable for surgery.  I have reviewed the patient's chart and labs.  Questions were answered to the patient's satisfaction.    2016/2017 Appropriate Use Criteria for Coronary Revascularization Clinical Presentation: Diabetes Mellitus? Symptom Status? S/P CABG? Antianginal Therapy (# of long-acting drugs)? Results of Non-invasive testing? FFR/iFR results in all diseased vessels? Patient undergoing renal transplant? Patient undergoing percutaneous valve procedure (TAVR, MitraClip, Others)? Symptom Status:  Ischemic Symptoms  Non-invasive Testing:  High risk  If no or indeterminate stress test, FFR/iFR results in all diseased vessels:  N/A  Diabetes Mellitus:  Yes  S/P CABG:  No  Antianginal therapy (number of long-acting drugs):  >=2  Patient undergoing renal transplant:  No  Patient undergoing percutaneous valve procedure:  No    newline 1 Vessel Disease PCI CABG  No proximal LAD involvement, No proximal left dominant LCX involvement A (8); Indication 2 M (6); Indication 2   Proximal left dominant LCX involvement A (8); Indication 5 A (8); Indication 5   Proximal LAD involvement A (8); Indication 5 A (8); Indication 5   newline 2 Vessel Disease  No proximal LAD involvement A (8); Indication 8 A (7); Indication 8   Proximal LAD involvement A (8); Indication 14 A (9); Indication 14   newline 3 Vessel Disease  Low disease complexity (e.g., focal stenoses, SYNTAX <=22) A (7); Indication 19 A (9);  Indication 19   Intermediate or high disease complexity (e.g., SYNTAX >=23) M (6); Indication 23 A (9); Indication 23   newline Left Main Disease  Isolated LMCA disease: ostial or midshaft A (7); Indication 24 A (9); Indication 24   Isolated LMCA disease: bifurcation involvement M (6); Indication 25 A (9); Indication 25   LMCA ostial or midshaft, concurrent low disease burden multivessel disease (e.g., 1-2 additional focal stenoses, SYNTAX <=22) A (7); Indication 26 A (9); Indication 26   LMCA ostial or midshaft, concurrent intermediate or high disease burden multivessel disease (e.g., 1-2 additional bifurcation stenoses, long stenoses, SYNTAX >=23) M (4); Indication 27 A (9); Indication 27   LMCA bifurcation involvement, concurrent low disease burden multivessel disease (e.g., 1-2 additional focal stenoses, SYNTAX <=22) M (6); Indication 28 A (9); Indication 28   LMCA bifurcation involvement, concurrent intermediate or high disease burden multivessel disease (e.g., 1-2 additional bifurcation stenoses, long stenoses, SYNTAX >=23) R (3); Indication 29 A (9); Indication 29     2012 Appropriate Use Criteria for Diagnostic Catheterization Home / Select Test of Interest Indication for RHC Cardiomyopathies Cardiomyopathies (Right and Left Heart Catheterization OR Right Heart Catheterization Alone With/Wit Cardiomyopathies  (Right and Left Heart Catheterization OR  Right Heart Catheterization Alone With/Without Left Ventriculography and Coronary Angiography)  Link Here: [email or web address] Indication:  Known or suspected cardiomyopathy with or without heart failure A (7) Indication: 93; Score 7   Shanece Cochrane J Lavoy Bernards

## 2019-07-06 NOTE — TOC Progression Note (Signed)
Transition of Care [facility]) - Progression Note    Patient Details  Name: Ethan Hamilton MRN: [id number] Date of Birth: [birth year]  [facility] CM/SW Contact  Leone Haven, RN Phone Number: 07/06/2019, 4:42 PM  Clinical Narrative:    NCM spoke with patient, he states he has no money for meds, he has no transportation.  NCM assisted him with Match for meds, [facility] pharmacy will fill meds for him.  NCM contacted [facility] in [place] to try to get him a follow up appt, they said he will need to come as a walk in to do paperwork.  NCM will asist with transportation tomorrow when ready to dc with uber. TOC will fill meds with 0.00 co pay tomorrow.         Expected Discharge Plan and Services                                                 Social Determinants of Health (SDOH) Interventions    Readmission Risk Interventions No flowsheet data found.

## 2019-07-06 NOTE — Progress Notes (Signed)
OT Cancellation Note  Patient Details Name: Ethan Hamilton MRN: [id number] DOB: 23-Mar-1960   Cancelled Treatment:    Reason Eval/Treat Not Completed: Medical issues which prohibited therapy(Pt with TR band and will taken off by 1pm per RN.)  Pt to be seen later this PM for OT Eval as schedule permits and as pt is medically appropriate.   Flora Lipps, OTR/L Acute Rehabilitation Services Pager: [phone] Office: [phone]   Flora Lipps 07/06/2019, 11:58 AM

## 2019-07-07 LAB — BASIC METABOLIC PANEL
Anion gap: 10 (ref 5–15)
BUN: 15 mg/dL (ref 6–20)
CO2: 26 mmol/L (ref 22–32)
Calcium: 8.9 mg/dL (ref 8.9–10.3)
Chloride: 101 mmol/L (ref 98–111)
Creatinine, Ser: 1.05 mg/dL (ref 0.61–1.24)
GFR calc Af Amer: >60 mL/min (ref >60)
GFR calc non Af Amer: >60 mL/min (ref >60)
Glucose, Bld: 243 mg/dL — ABNORMAL HIGH (ref 70–99)
Potassium: 4.4 mmol/L (ref 3.5–5.1)
Sodium: 137 mmol/L (ref 135–145)

## 2019-07-07 LAB — CBC
HCT: 47.5 % (ref 39.0–52.0)
Hemoglobin: 15 g/dL (ref 13.0–17.0)
MCH: 29.6 pg (ref 26.0–34.0)
MCHC: 31.6 g/dL (ref 30.0–36.0)
MCV: 93.7 fL (ref 80.0–100.0)
Platelets: 285 10*3/uL (ref 150–400)
RBC: 5.07 MIL/uL (ref 4.22–5.81)
RDW: 13.2 % (ref 11.5–15.5)
WBC: 7.1 10*3/uL (ref 4.0–10.5)
nRBC: 0 % (ref 0.0–0.2)

## 2019-07-07 LAB — GLUCOSE, CAPILLARY
Glucose-Capillary: 124 mg/dL — ABNORMAL HIGH (ref 70–99)
Glucose-Capillary: 254 mg/dL — ABNORMAL HIGH (ref 70–99)

## 2019-07-07 MED ORDER — TRUE METRIX BLOOD GLUCOSE TEST VI STRP: ORAL_STRIP | 12 refills | Status: AC

## 2019-07-07 MED ORDER — TRUE METRIX METER DEVI: 0 refills | Status: AC

## 2019-07-07 MED ORDER — TRUEPLUS LANCETS 28G MISC: 11 refills | Status: AC

## 2019-07-07 MED ORDER — EMPAGLIFLOZIN 10 MG PO TABS
10.0000 mg | ORAL_TABLET | Freq: Every day | ORAL | 0 refills | Status: DC
Start: 2019-07-07 — End: 2019-07-22

## 2019-07-07 MED FILL — TRUE METRIX BLOOD GLUCOSE M: W/DEVICE | 1 days supply | Qty: 1 | Fill #0

## 2019-07-07 MED FILL — TRUEplus LANCETS 28G MISC: 25 days supply | Qty: 100 | Fill #0

## 2019-07-07 MED FILL — JARDIANCE 10 MG TABLET: 10 | 14 days supply | Qty: 14 | Fill #0

## 2019-07-07 MED FILL — TRUE METRIX GLUCOSE TEST ST: 25 days supply | Qty: 100 | Fill #0

## 2019-07-07 NOTE — Progress Notes (Signed)
Heart Failure Stewardship Pharmacist Progress Note   PCP: Teodoro Spray, MD PCP-Cardiologist: No primary care provider on file.    HPI:  59 yo M with PMH significant for type 2 diabetes and morbid obesity admitted on 07/02/19 with shortness of breath, atrial flutter, and VT. Patient is a retired [employer], is uninsured, lives alone and does not have any family nearby. He has not seen a physician in a long time. Patient has had worsening shortness of breath and leg edema in 2-3 weeks. He denied chest pain. He presented to [facility] ED, where he was found to be in atrial flutter. Subsequently, his shortness of breath worsened. He subsequently had VT, and had to be cardioverted.  Patient was treated for congestive heart failure, with echocardiogram showing EF of 25 to 30% with moderate MR, TR and moderate pulmonary hypertension.  Coronary angiogram showed moderate nonobstructive proximal LAD disease.  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  Current HF Medications: Furosemide 40 mg BID Metoprolol succinate 50 mg daily Entresto 49/51 mg BID Spironolactone 50 mg daily Farxiga 10 mg daily  Prior to admission HF Medications: None - not taking any medications  Pertinent Lab Values: . Serum creatinine 1.05, BUN 15, Potassium 4.4, Sodium 137, BNP 134   Vital Signs: . Weight: 379 lbs (estimated dry weight: unk) . Blood pressure: 150-170/80s  . Heart rate: 80s   Medication Assistance / Insurance Benefits Check: Does the patient have prescription insurance?   no Type of insurance plan: None  Does the patient qualify for medication assistance through manufacturers or grants?   yes  Eligible grants and/or patient assistance programs:   Clifton Custard  Eliquis  Medication assistance applications in progress: All completed applications have been faxed as of 07/07/19  Clifton Custard  Eliquis  Medication assistance applications approved: None  Approved medication  assistance renewals will be completed by: Dr. Damian Leavell office  Outpatient Pharmacy:  Prior to admission outpatient pharmacy: [facility] Is the patient willing to use [facility] pharmacy at discharge?   Yes - pharmacy has been updated Is the patient willing to transition their outpatient pharmacy to utilize a [facility] outpatient pharmacy?   no    Assessment: 1. Acute systolic CHF (EF 44-31%), due to nonischemic cardiomyopathy. Coronary angiogram showed moderate nonobstructive proximal LAD disease. NYHA class II/III symptoms. - Continue furosemide 40 mg BID - Continue metoprolol succinate 50 mg daily -Continue Entresto 49/51 mg BID - consider increasing to 97/103 mg BID at follow up visit - Continue spironolactone 50 mg daily - Continue Farxiga 10 mg daily   Plan: 1) Medication changes recommended at this time: - None - consider increasing Entresto at next visit  2) Patient assistance application(s): - Sherryll Burger, Farxiga, and Eliquis applications have been completed and faxed to drug manufacturers. Response will be sent to patient and cardiology office.  - Renewals will be completed by Dr. Damian Leavell office  3)  Education  - Patient has been educated on current HF medications -Patient verbalizes understanding that over the next few months, these medication doses may change and more medications may be added to optimize HF regimen -Patient has been educated on basic disease state pathophysiology and goals of therapy -Time spent (30 min)   Danae Orleans, PharmD, BCPS Heart Failure Stewardship Pharmacist Phone [phone] 07/07/2019       12:19 PM

## 2019-07-07 NOTE — Progress Notes (Signed)
CARDIAC REHAB PHASE I   PRE:  Rate/Rhythm: 77 SR    BP: sitting 150/89    SaO2: 95 RA  MODE:  Ambulation: 250 ft   POST:  Rate/Rhythm: 102 ST    BP: sitting 170/98     SaO2: 95 RA  Pt able to stand and walk independently. Sts he feels better, decreased SOB. BP elevated. Discussed HF booklet including daily wts, low sodium, s/s HF, walking daily. Pt voiced understanding. He has limited income and we discussed ways of reducing sodium with cheaper food. I encouraged him to borrow a scale from friends until he can afford one (also asked CM if [facility] can supply one). Pt was somewhat receptive. [phone]   Harriet Masson CES, ACSM 07/07/2019 10:57 AM

## 2019-07-07 NOTE — Discharge Summary (Signed)
Physician Discharge Summary  Patient ID: Jettie BoozeBruce Banos MRN: [id number] DOB/AGE: 1960-10-09 59 y.o.  Admit date: 07/02/2019 Discharge date: 07/07/2019  Primary Discharge Diagnosis: HFrEF Atrial flutter Ventricular tachcyardia  Secondary Discharge Diagnosis: Hypertension Uncontrolled type 2 diabetes mellitus Morbid obesity  HPI:  59 y.o.Cacuasianmalewith hypertension, type 2 DM, morbid obesity, admitted with shortness of breath, atrial flutter, VT.  Patient is a retired Naval architecttruck driver, is uninsured, lives alone and does not have any family nearby. He has not seen a physician in a long time. Patient has had worsening shortness of breath and leg edema in 2-3 weeks. He denies chest pain. [facility], where he was found to be in atrial flutter. Subsequently, his shortness of breath worsened. He subsequently had VT, and had to be cardioverted. Given VT, I was consulted at the request of general cardiology on call Dr. Sharyn LullHarwani.   [facility], patient denies any chest pain. Shortness of breath is improved.  Hospital Course:   Patient was treated for congestive heart failure, with echocardiogram showing EF of 25 to 30% with moderate MR, TR and moderate pulmonary hypertension.  Coronary angiogram showed moderate nonobstructive proximal LAD disease.  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  Patient is breathing significantly improved with diuresis.  He was started on guideline directed medical therapy for heart failure as below.  Given his atrial flutter episode, he was started on Eliquis.  Patient did not have any recurrence of VT with appropriate management of heart failure.  He had only one episode of 8 beat SVT, which was hemodynamically nonsignificant.  For diabetes care, he was started on Metformin and Jardiance.  I scheduled follow-up transition of care appointment with myself on 07/10/2019.  Patient was given information regarding walk[place] clinic [place].  1 month medications were provided by social work given that patient is uninsured.  Medicaid application was also started.  Discharge Exam: Blood pressure (!) 170/98, pulse 84, temperature 98.1 F (36.7 C), temperature source Oral, resp. rate 19, height 5\' 10"  (1.778 m), weight (!) 172.2 kg, SpO2 97 %.   Physical Exam  Constitutional: He is oriented to person, place, and time. He appears well-developed and well-nourished. No distress.  Morbidly obese   HENT:  Head: Normocephalic and atraumatic.  Eyes: Pupils are equal, round, and reactive to light. Conjunctivae are normal.  Neck: No JVD present.  Cardiovascular: Normal rate, regular rhythm and intact distal pulses.  Murmur heard. High-pitched blowing holosystolic murmur is present with a grade of 2/6 at the apex. Pulmonary/Chest: Effort normal and breath sounds normal. He has no wheezes. He has no rales.  Abdominal: Soft. Bowel sounds are normal. There is no rebound.  Musculoskeletal:        General: Edema (Trace) present.  Lymphadenopathy:    He has no cervical adenopathy.  Neurological: He is alert and oriented to person, place, and time. No cranial nerve deficit.  Skin: Skin is warm and dry.  Psychiatric: He has a normal mood and affect.  Nursing note and vitals reviewed.   Significant Diagnostic Studies:  EKG 07/07/2019: Sinus rhythm. Left axis deviation. Left ventricular hypertrophy  Echocardiogram 07/07/2019: 1. Left ventricular ejection fraction, by estimation, is 20 to 25%. The  left ventricle has severely decreased function. The left ventricle  demonstrates global hypokinesis. The left ventricular internal cavity size  was mildly dilated. Left ventricular  diastolic parameters are consistent with Grade III diastolic dysfunction  (restrictive).  2. Right ventricular systolic function is  low normal. The right  ventricular size is normal.  3. Left atrial size was moderately dilated.  4. Moderate mitral  regurgitaion. Moderate tricuspid regurgitation.  5. Moderate pulmonary hypertension. Estimated RVSP 47 mmHg.   Coronary angiography 07/06/2019: LM: Normal LAD: Prox 50% stenosis. RFR 0.96 (Physilogicaltl non-significant) LCx: Normal LCx: Normal;  LVEDP 34 mmHg  Decompensated nonischemic cardiomyopathy  Labs:   Lab Results  Component Value Date   WBC 7.1 07/07/2019   HGB 15.0 07/07/2019   HCT 47.5 07/07/2019   MCV 93.7 07/07/2019   PLT 285 07/07/2019    Recent Labs  Lab 07/07/19 0849  NA 137  K 4.4  CL 101  CO2 26  BUN 15  CREATININE 1.05  CALCIUM 8.9  GLUCOSE 243*    Lipid Panel     Component Value Date/Time   CHOL 177 07/03/2019 0351   TRIG 96 07/03/2019 0351   HDL 30 (L) 07/03/2019 0351   CHOLHDL 5.9 07/03/2019 0351   VLDL 19 07/03/2019 0351   LDLCALC 128 (H) 07/03/2019 0351    BNP (last 3 results) Recent Labs    07/04/19 0806  BNP 134.2*    HEMOGLOBIN A1C Lab Results  Component Value Date   HGBA1C 11.7 (H) 07/02/2019   MPG 289.09 07/02/2019   Radiology: DG Chest 1 View  Result Date: 07/02/2019 CLINICAL DATA:  Shortness of breath EXAM: CHEST  1 VIEW COMPARISON:  07/02/2019 FINDINGS: Cardiomegaly with vascular congestion and hazy perihilar interstitial opacity. Possible small right effusion. No pneumothorax. IMPRESSION: Cardiomegaly with vascular congestion and bilateral perihilar hazy opacity, possible mild edema. Probable small right effusion. Electronically Signed   By: Jasmine Pang M.D.   On: 07/02/2019 22:11   CARDIAC CATHETERIZATION  Result Date: 07/06/2019  LV end diastolic pressure is severely elevated.  LM: Normal LAD: Prox 50% stenosis. RFR 0.96 (Physilogicaltl non-significant) LCx: Normal LCx: Normal; LVEDP 34 mmHg Decompensated nonischemic cardiomyopathy Elder Negus, MD [facility] [place] Pager: [phone] Office: [phone]    ECHOCARDIOGRAM COMPLETE  Result Date: 07/04/2019    ECHOCARDIOGRAM REPORT    Patient Name:   MALEKE FERIA Date of Exam: 07/03/2019 Medical Rec #:  [id number]    Height:       70.0 in Accession #:    [id number]   Weight:       392.4 lb Date of Birth:  Jan 22, 1961    BSA:          2.780 m Patient Age:    58 years     BP:           130/105 mmHg Patient Gender: M            HR:           83 bpm. Exam Location:  Inpatient Procedure: 2D Echo Indications:     ventricular tachycardia  History:         Patient has no prior history of Echocardiogram examinations.                  Arrythmias:Atrial Flutter.  Sonographer:     Delcie Roch RDCS Referring Phys:  [id number] [facility] J PATWARDHAN Diagnosing Phys: Truett Mainland MD  Sonographer Comments: Patient is morbidly obese. Image acquisition challenging due to patient body habitus. IMPRESSIONS  1. Left ventricular ejection fraction, by estimation, is 20 to 25%. The left ventricle has severely decreased function. The left ventricle demonstrates global hypokinesis. The left ventricular internal cavity size was mildly dilated. Left ventricular diastolic parameters are consistent with  Grade III diastolic dysfunction (restrictive).  2. Right ventricular systolic function is low normal. The right ventricular size is normal.  3. Left atrial size was moderately dilated.  4. Moderate mitral regurgitaion. Moderate tricuspid regurgitation.  5. Moderate pulmonary hypertension. Estimated RVSP 47 mmHg. FINDINGS  Left Ventricle: Left ventricular ejection fraction, by estimation, is 20 to 25%. The left ventricle has severely decreased function. The left ventricle demonstrates global hypokinesis. The left ventricular internal cavity size was mildly dilated. There is no left ventricular hypertrophy. Left ventricular diastolic parameters are consistent with Grade III diastolic dysfunction (restrictive). Right Ventricle: The right ventricular size is normal. No increase in right ventricular wall thickness. Right ventricular systolic function is low normal. There is  moderately elevated pulmonary artery systolic pressure. The tricuspid regurgitant velocity  is 2.84 m/s, and with an assumed right atrial pressure of 15 mmHg, the estimated right ventricular systolic pressure is 47.3 mmHg. Left Atrium: Left atrial size was moderately dilated. Right Atrium: Right atrial size was normal in size. Pericardium: There is no evidence of pericardial effusion. Mitral Valve: The mitral valve is grossly normal. Moderate mitral valve regurgitation. Tricuspid Valve: The tricuspid valve is grossly normal. Tricuspid valve regurgitation is moderate. Aortic Valve: The aortic valve is grossly normal. Aortic valve regurgitation is not visualized. Pulmonic Valve: The pulmonic valve was grossly normal. Pulmonic valve regurgitation is not visualized. Aorta: The aortic root is normal in size and structure. Venous: The inferior vena cava is dilated in size with less than 50% respiratory variability, suggesting right atrial pressure of 15 mmHg. IAS/Shunts: The interatrial septum was not assessed.  LEFT VENTRICLE PLAX 2D LVIDd:         6.20 cm      Diastology LVIDs:         5.30 cm      LV e' lateral:   4.90 cm/s LV PW:         1.40 cm      LV E/e' lateral: 20.8 LV IVS:        1.10 cm      LV e' medial:    4.13 cm/s LVOT diam:     2.50 cm      LV E/e' medial:  24.7 LV SV:         57 LV SV Index:   20 LVOT Area:     4.91 cm  LV Volumes (MOD) LV vol d, MOD A4C: 159.0 ml LV vol s, MOD A4C: 117.0 ml LV SV MOD A4C:     159.0 ml RIGHT VENTRICLE             IVC RV S prime:     10.70 cm/s  IVC diam: 2.40 cm TAPSE (M-mode): 1.4 cm LEFT ATRIUM              Index       RIGHT ATRIUM           Index LA diam:        4.70 cm  1.69 cm/m  RA Area:     18.10 cm LA Vol (A2C):   125.0 ml 44.97 ml/m RA Volume:   50.40 ml  18.13 ml/m LA Vol (A4C):   87.3 ml  31.41 ml/m LA Biplane Vol: 110.0 ml 39.57 ml/m  AORTIC VALVE LVOT Vmax:   72.20 cm/s LVOT Vmean:  51.600 cm/s LVOT VTI:    0.116 m  AORTA Ao Root diam: 3.50 cm MITRAL  VALVE  TRICUSPID VALVE MV Area (PHT): 3.91 cm     TR Peak grad:   32.3 mmHg MV Decel Time: 194 msec     TR Vmax:        284.00 cm/s MR Peak grad: 104.9 mmHg MR Mean grad: 65.0 mmHg     SHUNTS MR Vmax:      512.00 cm/s   Systemic VTI:  0.12 m MR Vmean:     377.0 cm/s    Systemic Diam: 2.50 cm MV E velocity: 102.00 cm/s MV A velocity: 35.30 cm/s MV E/A ratio:  2.89 Manish Patwardhan MD Electronically signed by Truett Mainland MD Signature Date/Time: 07/04/2019/9:26:19 AM    Final       FOLLOW UP PLANS AND APPOINTMENTS Discharge Instructions    (HEART FAILURE PATIENTS) Call MD:  Anytime you have any of the following symptoms: 1) 3 pound weight gain in 24 hours or 5 pounds in 1 week 2) shortness of breath, with or without a dry hacking cough 3) swelling in the hands, feet or stomach 4) if you have to sleep on extra pillows at night in order to breathe.   Complete by: As directed    Amb referral to [facility]IB Clinic   Complete by: As directed    Diet - low sodium heart healthy   Complete by: As directed    Increase activity slowly   Complete by: As directed      Allergies as of 07/07/2019   No Known Allergies     Medication List    STOP taking these medications   amLODipine 5 MG tablet Commonly known as: NORVASC   glimepiride 4 MG tablet Commonly known as: AMARYL   lisinopril-hydrochlorothiazide 20-12.5 MG tablet Commonly known as: ZESTORETIC   metoprolol tartrate 50 MG tablet Commonly known as: LOPRESSOR   Semaglutide (1 MG/DOSE) 2 MG/1.5ML Sopn     TAKE these medications   apixaban 5 MG Tabs tablet Commonly known as: ELIQUIS Take 1 tablet (5 mg total) by mouth 2 (two) times daily.   aspirin 81 MG chewable tablet Chew 1 tablet (81 mg total) by mouth daily.   atorvastatin 40 MG tablet Commonly known as: LIPITOR Take 1 tablet (40 mg total) by mouth daily.   dapagliflozin propanediol 10 MG Tabs tablet Commonly known as: Farxiga Take 1 tablet (10 mg total) by  mouth daily before breakfast.   empagliflozin 10 MG Tabs tablet Commonly known as: Jardiance Take 1 tablet (10 mg total) by mouth daily.   furosemide 40 MG tablet Commonly known as: LASIX Take 1 tablet (40 mg total) by mouth 2 (two) times daily.   metFORMIN 500 MG 24 hr tablet Commonly known as: GLUCOPHAGE-XR Take 2 tablets (1,000 mg total) by mouth 2 (two) times daily.   metoprolol succinate 50 MG 24 hr tablet Commonly known as: TOPROL-XL Take 1 tablet (50 mg total) by mouth daily. Take with or immediately following a meal.   sacubitril-valsartan 49-51 MG Commonly known as: ENTRESTO Take 1 tablet by mouth 2 (two) times daily.   spironolactone 50 MG tablet Commonly known as: ALDACTONE Take 1 tablet (50 mg total) by mouth daily.   True Metrix Blood Glucose Test test strip Generic drug: glucose blood Use as instructed up to 4 times daily   True Metrix Meter Devi Use as directed   TRUEplus Lancets 28G Misc Use as directed up to 4 times daily      Follow-up Information    Patwardhan, Anabel Bene, MD Follow up on 07/17/2019.  Specialties: Cardiology, Radiology Why: 3:30 PM Contact information: [place] [address] [facility] Follow up.   Why: go as a walk in patient to do paper work to get ast with future follow up apts and medications. Contact information: [place], [place] [address] [place] [place] [facility] Pager: [phone] Office: [phone] If no answer: [phone]

## 2019-07-07 NOTE — Progress Notes (Signed)
OT Cancellation Note  Patient Details Name: Ethan Hamilton MRN: [id number] DOB: December 10, 1960   Cancelled Treatment:    Reason Eval/Treat Not Completed: OT screened, no needs identified, will sign off; spoke with PT who reports pt mobilizing independently, currently with no acute OT needs. Will sign off at this time, please re-consult should pt's needs change.  Marcy Siren, OT Acute Rehabilitation Services Pager [phone] Office [phone]   Orlando Penner 07/07/2019, 12:24 PM

## 2019-07-07 NOTE — TOC Transition Note (Signed)
Transition of Care [facility]) - CM/SW Discharge Note   Patient Details  Name: Ethan Hamilton MRN: [id number] Date of Birth: [birth year]  Transition of Care [facility]) CM/SW Contact:  Leone Haven, RN Phone Number: 07/07/2019, 11:34 AM   Clinical Narrative:    Patient is for discharge today, he has meds in room, he will need transportation when ready.  NCM informed patient to go to [facility] clinic in [place] as a walk in to fill out paper work so that he can be seen at this office and so that he can get help with his medications.  He stated he understands.     Final next level of care: Home/Self Care Barriers to Discharge: No Barriers Identified   Patient Goals and CMS Choice Patient states their goals for this hospitalization and ongoing recovery are:: get better   Choice offered to / list presented to : NA  Discharge Placement                       Discharge Plan and Services                  DME Agency: NA       HH Arranged: NA          Social Determinants of Health (SDOH) Interventions     Readmission Risk Interventions No flowsheet data found.

## 2019-07-07 NOTE — Evaluation (Signed)
Physical Therapy Evaluation & Discharge Patient Details Name: Ethan Hamilton MRN: [id number] DOB: 1960-08-12 Today's Date: 07/07/2019   History of Present Illness  59 y.o. Admitted 5/21 for SOB, atrial flutter and VT. R/L heart cath and cornary angiography 5/23.  PMH  hypertension, type 2 DM, morbid obesity, pnemonia.  Clinical Impression  Pt presents with no major decreases in functional mobility secondary to above. PTA, pt lives alone in mobile home with 5 steps to enter.Today, pt able to complete transfers, amb independently without assistive devices. Pt completed stair negotiation without assistive device and safe to negotiate steps to enter home independently based on evaluation. Discussed d/c recommendations for home without follow up PT, pt agreed. Pt is independent from a mobility standpoint and can d/c from physical therapy acutely.   Pre amb 86 HR, 131/82 BP During amb spo2 90-95% RA Post amb 91 HR, 166/110 BP    Follow Up Recommendations No PT follow up    Equipment Recommendations  None recommended by PT    Recommendations for Other Services       Precautions / Restrictions Precautions Precautions: None      Mobility  Bed Mobility               General bed mobility comments: Pt received in recliner  Transfers Overall transfer level: Independent                  Ambulation/Gait Ambulation/Gait assistance: Independent Gait Distance (Feet): 450 Feet Assistive device: None Gait Pattern/deviations: WFL(Within Functional Limits);Step-through pattern;Wide base of support     General Gait Details: Pt amb with increased gait velocity and increased R/L sway. Pt did not require assistance with amb or stairs  Stairs Stairs: Yes Stairs assistance: Modified independent (Device/Increase time) Stair Management: One rail Left Number of Stairs: 11 General stair comments: Pt negotiated 1 flight up, down steps with no LOB.  Wheelchair Mobility    Modified  Rankin (Stroke Patients Only)       Balance Overall balance assessment: No apparent balance deficits (not formally assessed)                                           Pertinent Vitals/Pain Pain Assessment: No/denies pain    Home Living Family/patient expects to be discharged to:: Private residence Living Arrangements: Alone   Type of Home: Mobile home Home Access: Stairs to enter Entrance Stairs-Rails: Right Entrance Stairs-Number of Steps: 5 Home Layout: One level Home Equipment: None Additional Comments: Pt reports he does not have nor need home equipment    Prior Function Level of Independence: Independent         Comments: Not currently working     Hand Dominance        Extremity/Trunk Assessment   Upper Extremity Assessment Upper Extremity Assessment: Overall WFL for tasks assessed    Lower Extremity Assessment Lower Extremity Assessment: Overall WFL for tasks assessed    Cervical / Trunk Assessment Cervical / Trunk Assessment: Lordotic  Communication   Communication: No difficulties  Cognition Arousal/Alertness: Awake/alert Behavior During Therapy: WFL for tasks assessed/performed Overall Cognitive Status: Within Functional Limits for tasks assessed                                 General Comments: Required coaxing to complete evaluation  General Comments General comments (skin integrity, edema, etc.): Pt reports he is "independent" and does not need assistance. Pt reports he has trouble getting in and out of tub shower but makes it work with use of toliet and sink as supports.    Exercises     Assessment/Plan    PT Assessment Patent does not need any further PT services  PT Problem List         PT Treatment Interventions      PT Goals (Current goals can be found in the Care Plan section)  Acute Rehab PT Goals PT Goal Formulation: All assessment and education complete, DC therapy    Frequency      Barriers to discharge        Co-evaluation               AM-PAC PT "6 Clicks" Mobility  Outcome Measure Help needed turning from your back to your side while in a flat bed without using bedrails?: None Help needed moving from lying on your back to sitting on the side of a flat bed without using bedrails?: None Help needed moving to and from a bed to a chair (including a wheelchair)?: None Help needed standing up from a chair using your arms (e.g., wheelchair or bedside chair)?: None Help needed to walk in hospital room?: None Help needed climbing 3-5 steps with a railing? : None 6 Click Score: 24    End of Session Equipment Utilized During Treatment: Gait belt Activity Tolerance: Patient tolerated treatment well Patient left: in chair;with call bell/phone within reach Nurse Communication: Mobility status;Other (comment)(vital signs)      Time: 4782-9562 PT Time Calculation (min) (ACUTE ONLY): 17 min   Charges:   PT Evaluation $PT Eval Moderate Complexity: 1 Mod          Keller Dixon SPT 07/07/2019   Sanjuana Letters 07/07/2019, 12:08 PM

## 2019-07-07 NOTE — Progress Notes (Signed)
Inpatient Diabetes Program Recommendations  AACE/ADA: New Consensus Statement on Inpatient Glycemic Control (2015)  Target Ranges:  Prepandial:   less than 140 mg/dL      Peak postprandial:   less than 180 mg/dL (1-2 hours)      Critically ill patients:  140 - 180 mg/dL   Lab Results  Component Value Date   GLUCAP 124 (H) 07/07/2019   HGBA1C 11.7 (H) 07/02/2019   Spoke with patient @ bedside and reviewed diabetes management guidelines and encouraged followup with PCP and take log of CBGs to visits. Patient limits conversation and expressed frustrations not having his right medications and prefers not to go on insulin for diabetes management.  Thank you, Ethan Hamilton. Hanks, RN, MSN, CDE  Diabetes Coordinator Inpatient Glycemic Control Team Team Pager [phone] (8am-5pm) 07/07/2019 11:06 AM

## 2019-07-07 NOTE — Progress Notes (Signed)
Patient provide with verbal discharge instructions. Paper copy provide to patient.  RN answered all questions. No further questions from patient. VSS at discharge.  IV removed. Patient belongings sent with patient. Patient discharge via wheelchair through [employer] entrance to [place].

## 2019-07-16 NOTE — Progress Notes (Deleted)
   Follow up visit  Subjective:   Krishon Adkison, male    DOB: 12-Oct-1960, 59 y.o.   MRN: [id number]    *** HPI  No chief complaint on file.   59 y.o. *** male with ***  ***  *** Current Outpatient Medications on File Prior to Visit  Medication Sig Dispense Refill  . apixaban (ELIQUIS) 5 MG TABS tablet Take 1 tablet (5 mg total) by mouth 2 (two) times daily. 60 tablet 2  . aspirin 81 MG chewable tablet Chew 1 tablet (81 mg total) by mouth daily. 30 tablet 2  . atorvastatin (LIPITOR) 40 MG tablet Take 1 tablet (40 mg total) by mouth daily. 30 tablet 2  . Blood Glucose Monitoring Suppl (TRUE METRIX METER) DEVI Use as directed 1 each 0  . dapagliflozin propanediol (FARXIGA) 10 MG TABS tablet Take 1 tablet (10 mg total) by mouth daily before breakfast. 30 tablet 2  . empagliflozin (JARDIANCE) 10 MG TABS tablet Take 1 tablet (10 mg total) by mouth daily. 14 tablet 0  . furosemide (LASIX) 40 MG tablet Take 1 tablet (40 mg total) by mouth 2 (two) times daily. 60 tablet 2  . glucose blood (TRUE METRIX BLOOD GLUCOSE TEST) test strip Use as instructed up to 4 times daily 100 each 12  . metFORMIN (GLUCOPHAGE-XR) 500 MG 24 hr tablet Take 2 tablets (1,000 mg total) by mouth 2 (two) times daily. 60 tablet 2  . metoprolol succinate (TOPROL-XL) 50 MG 24 hr tablet Take 1 tablet (50 mg total) by mouth daily. Take with or immediately following a meal. 30 tablet 2  . sacubitril-valsartan (ENTRESTO) 49-51 MG Take 1 tablet by mouth 2 (two) times daily. 60 tablet 2  . spironolactone (ALDACTONE) 50 MG tablet Take 1 tablet (50 mg total) by mouth daily. 30 tablet 2  . TRUEplus Lancets 28G MISC Use as directed up to 4 times daily 100 each 11   No current facility-administered medications on file prior to visit.    Cardiovascular & other pertient studies:  *** EKG ***/***/202***: ***  Recent labs: ***/***/202***: Glucose ***, BUN/Cr ***/***. EGFR ***. Na/K ***/***. ***Rest of the CMP normal H/H  ***/***. MCV ***. Platelets *** ***HbA1C ***% Chol ***, TG ***, HDL ***, LDL *** ***TSH ***normal   *** ROS      *** There were no vitals filed for this visit.  There is no height or weight on file to calculate BMI. There were no vitals filed for this visit.  *** Objective:   Physical Exam        Assessment & Recommendations:   ***  ***    Manish Esther Hardy, MD [facility] [place] Pager: [phone] Office: [phone]

## 2019-07-17 ENCOUNTER — Ambulatory Visit: Payer: Self-pay | Admitting: Cardiology

## 2019-07-23 ENCOUNTER — Ambulatory Visit: Payer: Self-pay | Admitting: Cardiology

## 2019-07-23 NOTE — Progress Notes (Deleted)
Primary Physician/Referring:  Sarina Ser, MD  Patient ID: Ethan Hamilton, male    DOB: [birth year], 59 y.o.   MRN: [id number]  No chief complaint on file.  HPI:    Ethan Hamilton  is a 59 y.o. Caucasianwith hypertension, type 2 DM, morbid obesity, admitted with shortness of breath, atrial flutter, VT.  [facility] HPI Dr. Fraser Din:  59 y.o.Cacuasianmalewith hypertension, type 2 DM, morbid obesity, admitted with shortness of breath, atrial flutter, VT.  Patient is a retired Administrator, is uninsured, lives alone and does not have any family nearby. He has not seen a physician in a long time. Patient has had worsening shortness of breath and leg edema in 2-3 weeks. He denies chest pain. He presented to [facility] ED today, where he was found to be in atrial flutter. Subsequently, his shortness of breath worsened. He subsequently had VT, and had to be cardioverted. Given VT, I was consulted at the request of general cardiology on call Dr. Terrence Dupont.   On arrival to [facility], patient denies any chest pain. Shortness of breath is improved.  ***Hospital Course:  Patient was treated for congestive heart failure, with echocardiogram showing EF of 25 to 30% with moderate MR, TR and moderate pulmonary hypertension.  Coronary angiogram showed moderate nonobstructive proximal LAD disease.  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  Patient is breathing significantly improved with diuresis.  He was started on guideline directed medical therapy for heart failure as below.  Given his atrial flutter episode, he was started on Eliquis.  Patient did not have any recurrence of VT with appropriate management of heart failure.  He had only one episode of 8 beat SVT, which was hemodynamically nonsignificant.  For diabetes care, he was started on Metformin and Jardiance.  I scheduled follow-up transition of care appointment with myself on 07/10/2019.  Patient was given information regarding walk-in clinic in [place].   1 month medications were provided by social work given that patient is uninsured.  Medicaid application was also started.  Past Medical History:  Diagnosis Date   Diabetes mellitus without complication ([place])    Dyspnea    Hypertension    Obesity    Pneumonia    Past Surgical History:  Procedure Laterality Date   GASTRIC BYPASS     INTRAVASCULAR PRESSURE WIRE/FFR STUDY N/A 07/06/2019   Procedure: INTRAVASCULAR PRESSURE WIRE/FFR STUDY;  Surgeon: Nigel Mormon, MD;  Location: [facility];  Service: Cardiovascular;  Laterality: N/A;   LEFT HEART CATH AND CORONARY ANGIOGRAPHY N/A 07/06/2019   Procedure: LEFT HEART CATH AND CORONARY ANGIOGRAPHY;  Surgeon: Nigel Mormon, MD;  Location: [facility];  Service: Cardiovascular;  Laterality: N/A;   WISDOM TOOTH EXTRACTION     Family History  Problem Relation Age of Onset   Heart attack Mother    Cancer Father     Social History   Tobacco Use   Smoking status: Never Smoker   Smokeless tobacco: Never Used  Substance Use Topics   Alcohol use: Not Currently   Marital Status: Single  ROS  ***Review of Systems  Cardiovascular: Negative for dyspnea on exertion, leg swelling and syncope.  Gastrointestinal: Negative for melena.   Objective  There were no vitals taken for this visit.  Vitals with BMI 07/07/2019 07/07/2019 07/07/2019  Height - - -  Weight - - -  BMI - - -  Systolic 242 353 614  Diastolic 98 88 85  Pulse 84 88 81     ***  Physical Exam Constitutional:      General: He is not in acute distress.    Appearance: He is well-developed.  Cardiovascular:     Rate and Rhythm: Normal rate and regular rhythm.     Pulses: Intact distal pulses.     Heart sounds: No murmur heard.  No gallop.      Comments: No leg edema, no JVD.  Pulmonary:     Effort: Pulmonary effort is normal. No accessory muscle usage.     Breath sounds: Normal breath sounds.  Abdominal:     General: Bowel sounds are  normal.     Palpations: Abdomen is soft.    Laboratory examination:   Recent Labs    07/05/19 0220 07/06/19 0236 07/07/19 0849  NA 138 141 137  K 3.5 3.7 4.4  CL 101 99 101  CO2 28 29 26   GLUCOSE 91 183* 243*  BUN 13 12 15   CREATININE 1.13 1.15 1.05  CALCIUM 8.4* 8.7* 8.9  GFRNONAA >60 >60 >60  GFRAA >60 >60 >60   CrCl cannot be calculated (Unknown ideal weight.).  CMP Latest Ref Rng & Units 07/07/2019 07/06/2019 07/05/2019  Glucose 70 - 99 mg/dL 07/08/2019) 07/07/2019) 91  BUN 6 - 20 mg/dL 15 12 13   Creatinine 0.61 - 1.24 mg/dL 240(X 735(H  Sodium 135 - 145 mmol/L 137 141 138  Potassium 3.5 - 5.1 mmol/L 4.4 3.7 3.5  Chloride 98 - 111 mmol/L 101 99 101  CO2 22 - 32 mmol/L 26 29 28   Calcium 8.9 - 10.3 mg/dL 8.9 2.99) 2.42)   CBC Latest Ref Rng & Units 07/07/2019 07/06/2019 07/05/2019  WBC 4.0 - 10.5 K/uL 7.1 6.8 7.2  Hemoglobin 13.0 - 17.0 g/dL 6.2(I 07/09/2019 07/08/2019  Hematocrit 39 - 52 % 47.5 43.7 43.6  Platelets 150 - 400 K/uL 285 278 272    Lipid Panel Recent Labs    07/03/19 0351  CHOL 177  TRIG 96  LDLCALC 128*  VLDL 19  HDL 30*  CHOLHDL 5.9    HEMOGLOBIN A1C Lab Results  Component Value Date   HGBA1C 11.7 (H) 07/02/2019   MPG 289.09 07/02/2019   TSH No results for input(s): TSH in the last 8760 hours.  BNP    Component Value Date/Time   BNP 134.2 (H) 07/04/2019 0806    External labs:  *** HDL 30.000 07/03/2019 LDL 128.000 07/03/2019 Cholesterol, total 177.000 07/03/2019 Triglycerides 96.000 07/03/2019  A1C 11.700 07/02/2019 Glucose Random 243.000 07/07/2019 BUN 15.000 07/07/2019 Creatinine, Serum 1.050 07/07/2019  Medications and allergies  No Known Allergies   Current Outpatient Medications  Medication Instructions   apixaban (ELIQUIS) 5 mg, Oral, 2 times daily   aspirin 81 mg, Oral, Daily   atorvastatin (LIPITOR) 40 mg, Oral, Daily   Blood Glucose Monitoring Suppl (TRUE METRIX METER) DEVI Use as directed   dapagliflozin propanediol (FARXIGA) 10  mg, Oral, Daily before breakfast   furosemide (LASIX) 40 mg, Oral, 2 times daily   glucose blood (TRUE METRIX BLOOD GLUCOSE TEST) test strip Use as instructed up to 4 times daily   metFORMIN (GLUCOPHAGE-XR) 1,000 mg, Oral, 2 times daily   metoprolol succinate (TOPROL-XL) 50 mg, Oral, Daily, Take with or immediately following a meal.   sacubitril-valsartan (ENTRESTO) 49-51 MG 1 tablet, Oral, 2 times daily   spironolactone (ALDACTONE) 50 mg, Oral, Daily   TRUEplus Lancets 28G MISC Use as directed up to 4 times daily   There are no discontinued medications.  Radiology:   No results  found.  Cardiac Studies:   Coronary angiography 07/06/2019: LM: Normal LAD: Prox 50% stenosis. RFR 0.96 (Physilogicaltl non-significant) LCx: Normal LCx: Normal;  LVEDP 34 mmHg  Decompensated nonischemic cardiomyopathy  Echocardiogram 07/07/2019: 1. Left ventricular ejection fraction, by estimation, is 20 to 25%. The left ventricle has severely decreased function. The left ventricle demonstrates global hypokinesis.  The left ventricular internal cavity size was mildly dilated. Left ventricular diastolic parameters are consistent with Grade III diastolic dysfunction (restrictive).  2. Right ventricular systolic function is low normal. The right ventricular size is normal.  3. Left atrial size was moderately dilated.  4. Moderate mitral regurgitaion. Moderate tricuspid regurgitation.  5. Moderate pulmonary hypertension. Estimated RVSP 47 mmHg.  EKG  ***    07/07/2019: Sinus rhythm. Left axis deviation. Left ventricular hypertrophy  Assessment  No diagnosis found.   Recommendations:   ***  Yates Decamp, MD, [facility] 07/23/2019, 1:40 PM [facility] [place] Pager: [phone] Office: [phone]

## 2019-08-10 ENCOUNTER — Telehealth: Payer: Self-pay

## 2019-08-10 NOTE — Telephone Encounter (Signed)
Called pt back and asked what medication was it that he needed. Pt mention he needed all of them not has no money nor insurance and pt canceled his appt twice. Tried to explain to pt what we could do but pt got rudy. I informed him I would like him back when ever you respond back.

## 2019-08-10 NOTE — Telephone Encounter (Signed)
Refill for 3 months please

## 2019-08-11 ENCOUNTER — Other Ambulatory Visit: Payer: Self-pay

## 2019-08-11 MED ORDER — SACUBITRIL-VALSARTAN 49-51 MG PO TABS: 1.0000 | ORAL_TABLET | Freq: Two times a day (BID) | ORAL | 3 refills | Status: AC

## 2019-08-11 MED ORDER — ASPIRIN 81 MG PO CHEW: 81.0000 mg | CHEWABLE_TABLET | Freq: Every day | ORAL | 3 refills | Status: AC

## 2019-08-11 MED ORDER — APIXABAN 5 MG PO TABS: 5.0000 mg | ORAL_TABLET | Freq: Two times a day (BID) | ORAL | 3 refills | Status: AC

## 2019-08-11 MED ORDER — SPIRONOLACTONE 50 MG PO TABS: 50.0000 mg | ORAL_TABLET | Freq: Every day | ORAL | 3 refills | Status: AC

## 2019-08-11 MED ORDER — DAPAGLIFLOZIN PROPANEDIOL 10 MG PO TABS: 10.0000 mg | ORAL_TABLET | Freq: Every day | ORAL | 3 refills | Status: AC

## 2019-08-11 MED ORDER — METFORMIN HCL ER 500 MG PO TB24: 1000.0000 mg | ORAL_TABLET | Freq: Two times a day (BID) | ORAL | 3 refills | Status: AC

## 2019-08-11 MED ORDER — METOPROLOL SUCCINATE ER 50 MG PO TB24: 50.0000 mg | ORAL_TABLET | Freq: Every day | ORAL | 3 refills | Status: AC

## 2019-08-11 MED ORDER — FUROSEMIDE 40 MG PO TABS: 40.0000 mg | ORAL_TABLET | Freq: Two times a day (BID) | ORAL | 3 refills | Status: AC

## 2019-08-11 NOTE — Telephone Encounter (Signed)
Called pt to inform him that I could refill his medication and whenever he can he can go pick them up and to make an appt whenever he can. Pt understood

## 2021-09-19 IMAGING — DX DG CHEST 1V
1 series · 1 of 1 positions shown · non-contrast
Comparison: 07/02/2019

CLINICAL DATA: Shortness of breath

EXAM:
CHEST  1 VIEW

[chest]
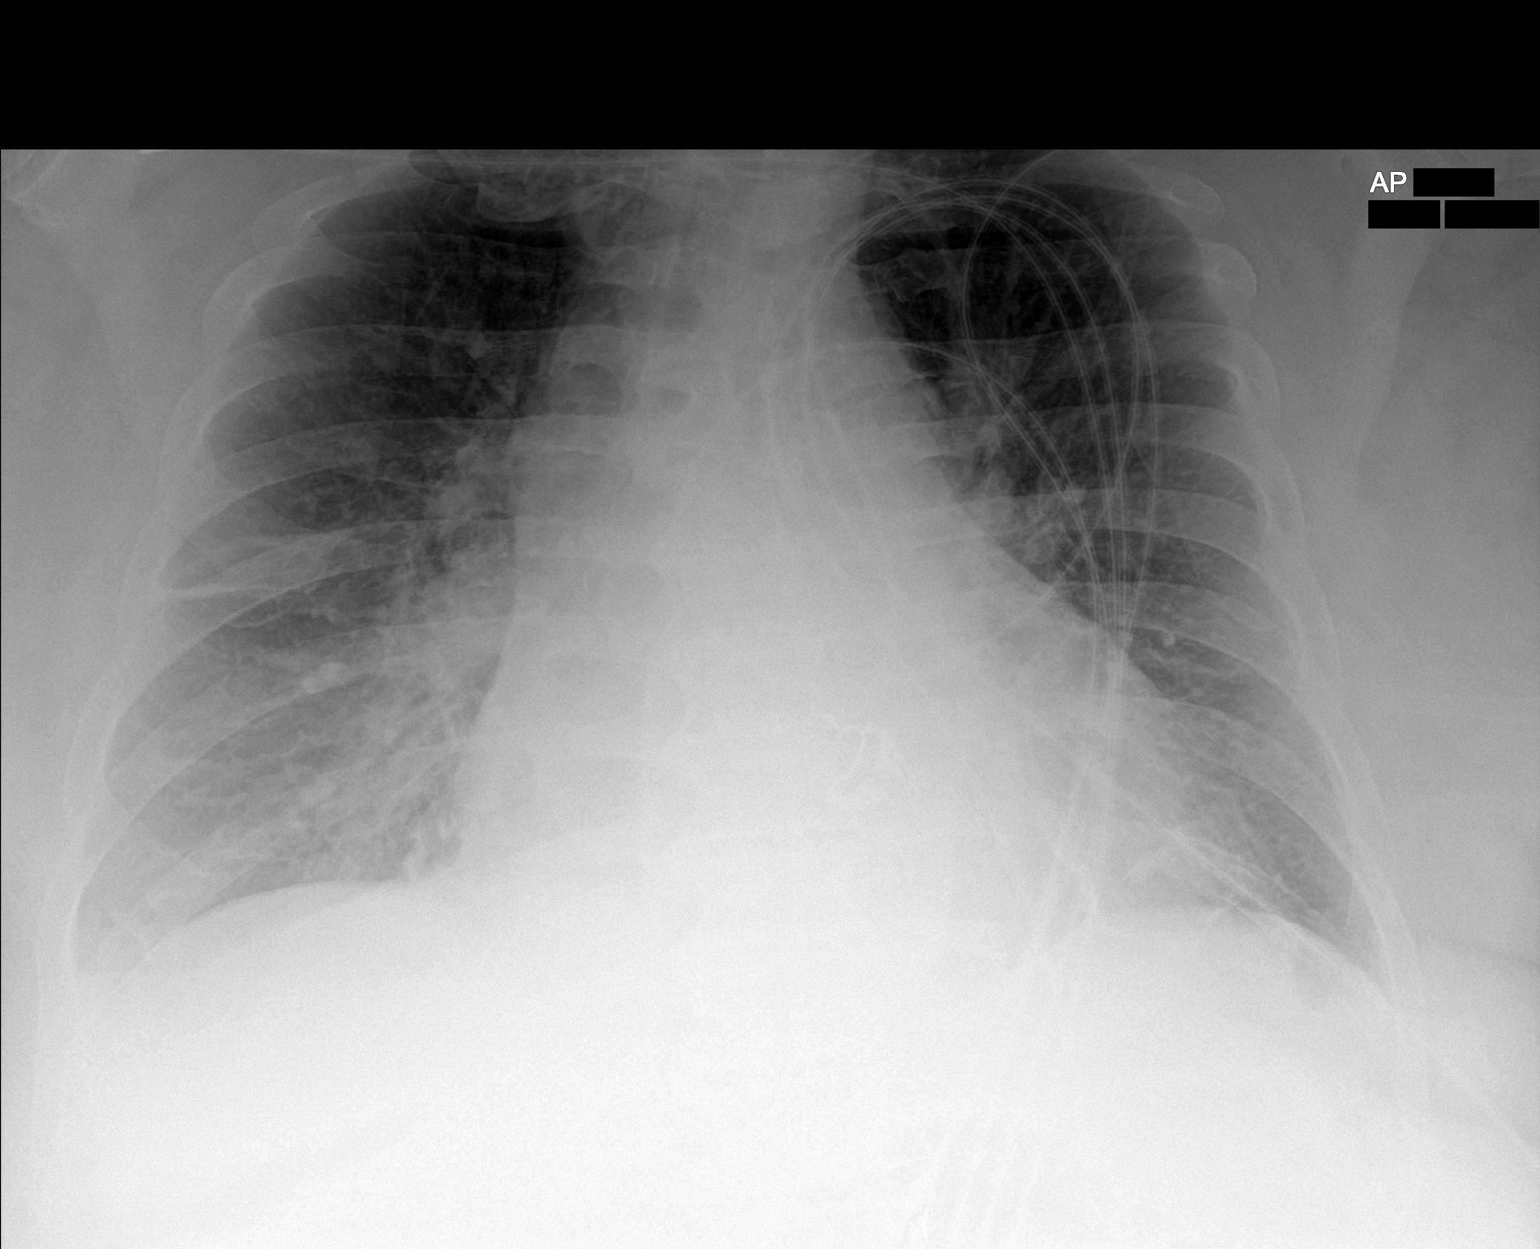

[1 of 1 positions shown; findings below may reference images not displayed]

FINDINGS: Cardiomegaly with vascular congestion and hazy perihilar
interstitial opacity. Possible small right effusion. No
pneumothorax.
IMPRESSION: Cardiomegaly with vascular congestion and bilateral perihilar hazy
opacity, possible mild edema. Probable small right effusion.

## 2022-02-12 DEATH — deceased
